# Patient Record
Sex: Male | Born: 1970 | Race: White | Hispanic: No | Marital: Married | Smoking: Never smoker
Health system: Southern US, Community
[De-identification: ages and names within clinical notes are randomized; demographics above are authoritative.]

## PROBLEM LIST (undated history)

## (undated) ENCOUNTER — Ambulatory Visit: Admission: EM

## (undated) DIAGNOSIS — E669 Obesity, unspecified: Secondary | ICD-10-CM

## (undated) DIAGNOSIS — J449 Chronic obstructive pulmonary disease, unspecified: Secondary | ICD-10-CM

## (undated) DIAGNOSIS — I10 Essential (primary) hypertension: Secondary | ICD-10-CM

## (undated) DIAGNOSIS — E119 Type 2 diabetes mellitus without complications: Secondary | ICD-10-CM

## (undated) HISTORY — DX: Type 2 diabetes mellitus without complications: E11.9

## (undated) HISTORY — DX: Essential (primary) hypertension: I10

---

## 2006-03-12 ENCOUNTER — Emergency Department (HOSPITAL_COMMUNITY): Admission: EM | Admit: 2006-03-12 | Discharge: 2006-03-12 | Payer: Self-pay | Admitting: Emergency Medicine

## 2006-04-05 ENCOUNTER — Ambulatory Visit: Payer: Self-pay | Admitting: Internal Medicine

## 2006-04-12 ENCOUNTER — Ambulatory Visit: Payer: Self-pay | Admitting: Internal Medicine

## 2009-03-25 ENCOUNTER — Ambulatory Visit: Payer: Self-pay | Admitting: Internal Medicine

## 2010-10-12 ENCOUNTER — Emergency Department: Payer: Self-pay | Admitting: Emergency Medicine

## 2012-03-01 ENCOUNTER — Emergency Department: Payer: Self-pay | Admitting: Emergency Medicine

## 2012-07-20 ENCOUNTER — Emergency Department: Payer: Self-pay | Admitting: Emergency Medicine

## 2013-01-08 ENCOUNTER — Emergency Department: Payer: Self-pay | Admitting: Emergency Medicine

## 2013-01-08 LAB — BASIC METABOLIC PANEL
Anion Gap: 3 — ABNORMAL LOW (ref 7–16)
BUN: 13 mg/dL (ref 7–18)
Chloride: 104 mmol/L (ref 98–107)
Creatinine: 1.18 mg/dL (ref 0.60–1.30)
Glucose: 147 mg/dL — ABNORMAL HIGH (ref 65–99)
Osmolality: 280 (ref 275–301)
Sodium: 139 mmol/L (ref 136–145)

## 2013-01-08 LAB — CK TOTAL AND CKMB (NOT AT ARMC)
CK, Total: 119 U/L (ref 35–232)
CK-MB: 2 ng/mL (ref 0.5–3.6)

## 2013-01-09 LAB — TROPONIN I: Troponin-I: <0.02 ng/mL

## 2013-01-09 LAB — CBC
HCT: 36.8 % — ABNORMAL LOW (ref 40.0–52.0)
HGB: 12.7 g/dL — ABNORMAL LOW (ref 13.0–18.0)
MCH: 27.7 pg (ref 26.0–34.0)
Platelet: 262 10*3/uL (ref 150–440)

## 2013-01-11 DIAGNOSIS — G4730 Sleep apnea, unspecified: Secondary | ICD-10-CM | POA: Insufficient documentation

## 2013-01-11 DIAGNOSIS — I160 Hypertensive urgency: Secondary | ICD-10-CM

## 2013-01-11 DIAGNOSIS — G4733 Obstructive sleep apnea (adult) (pediatric): Secondary | ICD-10-CM | POA: Insufficient documentation

## 2013-01-11 HISTORY — DX: Hypertensive urgency: I16.0

## 2013-04-01 ENCOUNTER — Ambulatory Visit: Payer: Self-pay | Admitting: Family Medicine

## 2013-04-05 ENCOUNTER — Ambulatory Visit: Payer: Self-pay

## 2013-06-11 ENCOUNTER — Ambulatory Visit: Payer: Self-pay

## 2013-06-22 ENCOUNTER — Ambulatory Visit: Payer: Self-pay

## 2013-08-31 ENCOUNTER — Ambulatory Visit: Payer: Self-pay | Admitting: Physician Assistant

## 2013-09-09 ENCOUNTER — Ambulatory Visit: Payer: Self-pay | Admitting: Family Medicine

## 2013-09-10 ENCOUNTER — Ambulatory Visit: Payer: Self-pay | Admitting: Emergency Medicine

## 2013-09-25 ENCOUNTER — Ambulatory Visit: Payer: Self-pay | Admitting: Physician Assistant

## 2013-10-16 ENCOUNTER — Ambulatory Visit: Payer: Self-pay | Admitting: Physician Assistant

## 2013-12-21 DIAGNOSIS — E118 Type 2 diabetes mellitus with unspecified complications: Secondary | ICD-10-CM | POA: Insufficient documentation

## 2013-12-21 DIAGNOSIS — N529 Male erectile dysfunction, unspecified: Secondary | ICD-10-CM | POA: Insufficient documentation

## 2013-12-21 DIAGNOSIS — E1165 Type 2 diabetes mellitus with hyperglycemia: Secondary | ICD-10-CM | POA: Insufficient documentation

## 2013-12-21 DIAGNOSIS — E1129 Type 2 diabetes mellitus with other diabetic kidney complication: Secondary | ICD-10-CM | POA: Insufficient documentation

## 2013-12-21 DIAGNOSIS — E119 Type 2 diabetes mellitus without complications: Secondary | ICD-10-CM | POA: Insufficient documentation

## 2013-12-21 DIAGNOSIS — I152 Hypertension secondary to endocrine disorders: Secondary | ICD-10-CM | POA: Insufficient documentation

## 2013-12-21 DIAGNOSIS — I10 Essential (primary) hypertension: Secondary | ICD-10-CM | POA: Insufficient documentation

## 2013-12-28 DIAGNOSIS — D509 Iron deficiency anemia, unspecified: Secondary | ICD-10-CM | POA: Insufficient documentation

## 2014-09-30 ENCOUNTER — Ambulatory Visit: Payer: Self-pay | Admitting: Physician Assistant

## 2015-07-23 IMAGING — CR DG KNEE STANDING AP BILAT
3 series · 3 of 3 positions shown · non-contrast
Comparison: 04/01/2013 RIGHT knee radiographs

CLINICAL DATA: Fell 2 days ago onto RIGHT knee, pain at knee joint
especially laterally and posteriorly, hard time flexing RIGHT knee

EXAM:
RIGHT KNEE - COMPLETE 4+ VIEW; BILATERAL KNEES STANDING - 1 VIEW

[knee ap]
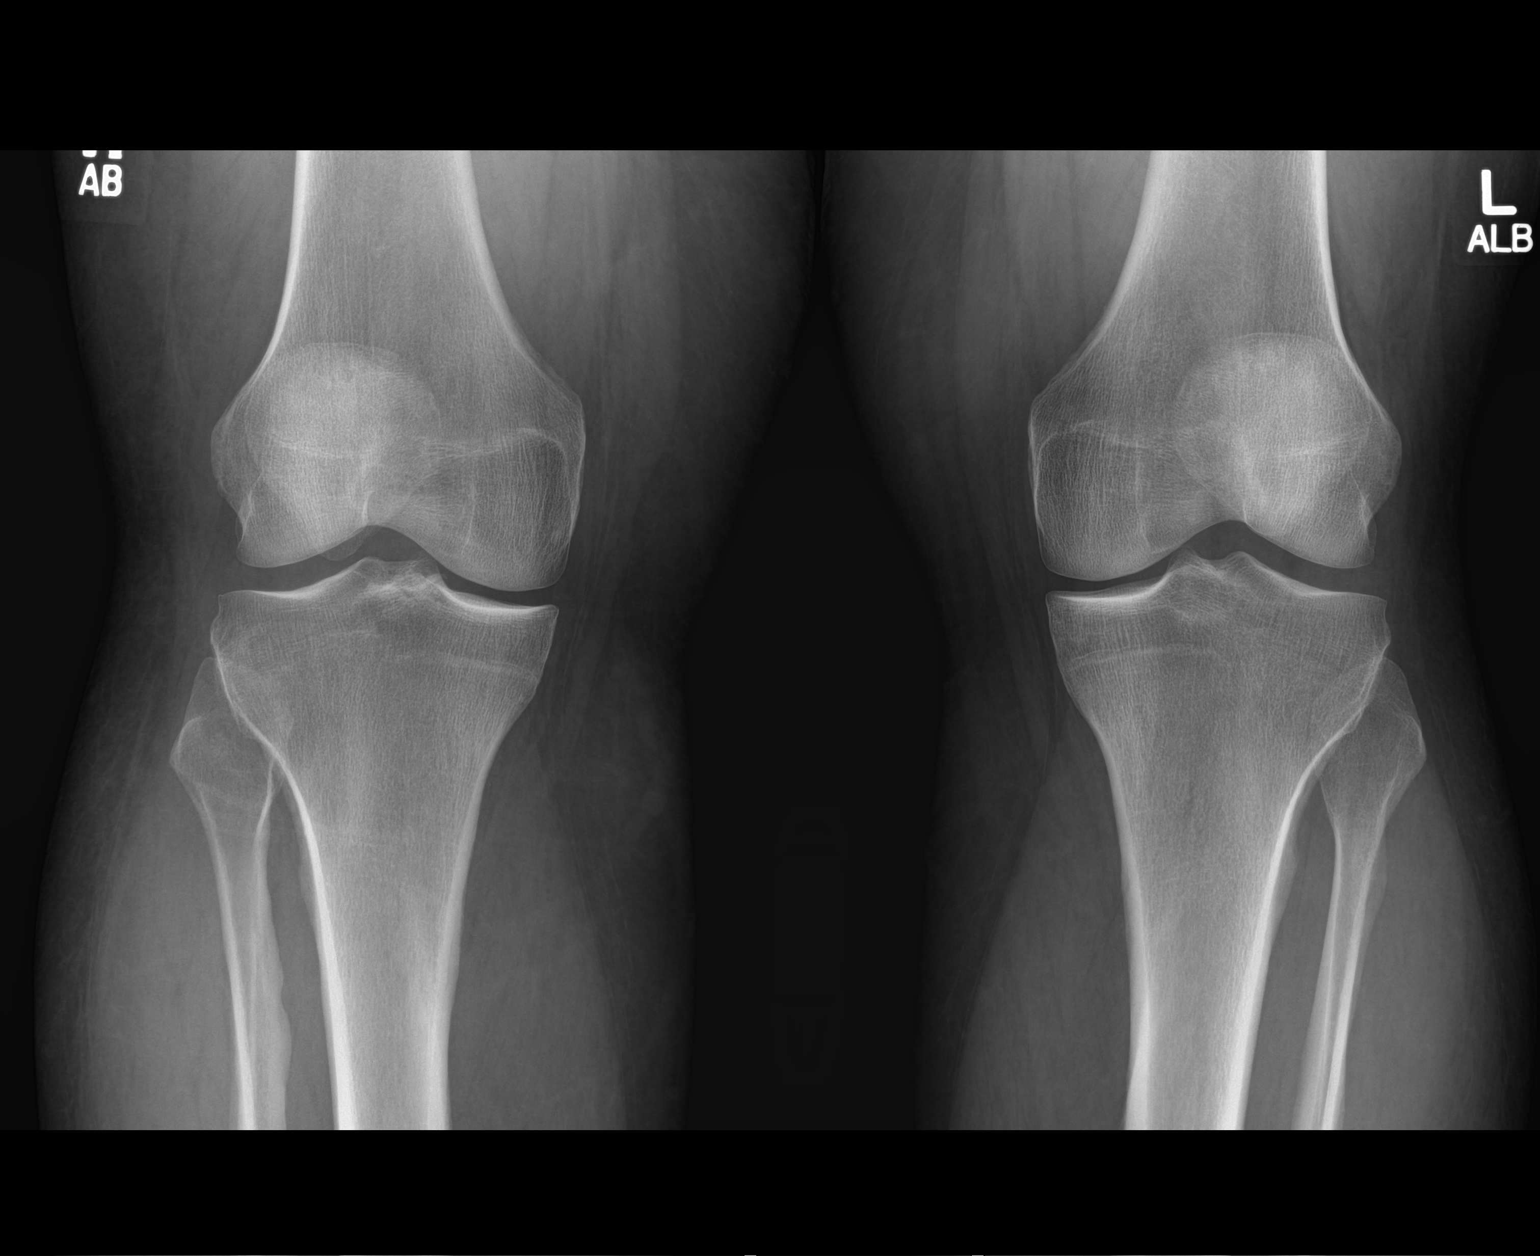

[knee lat (1 of 2)]
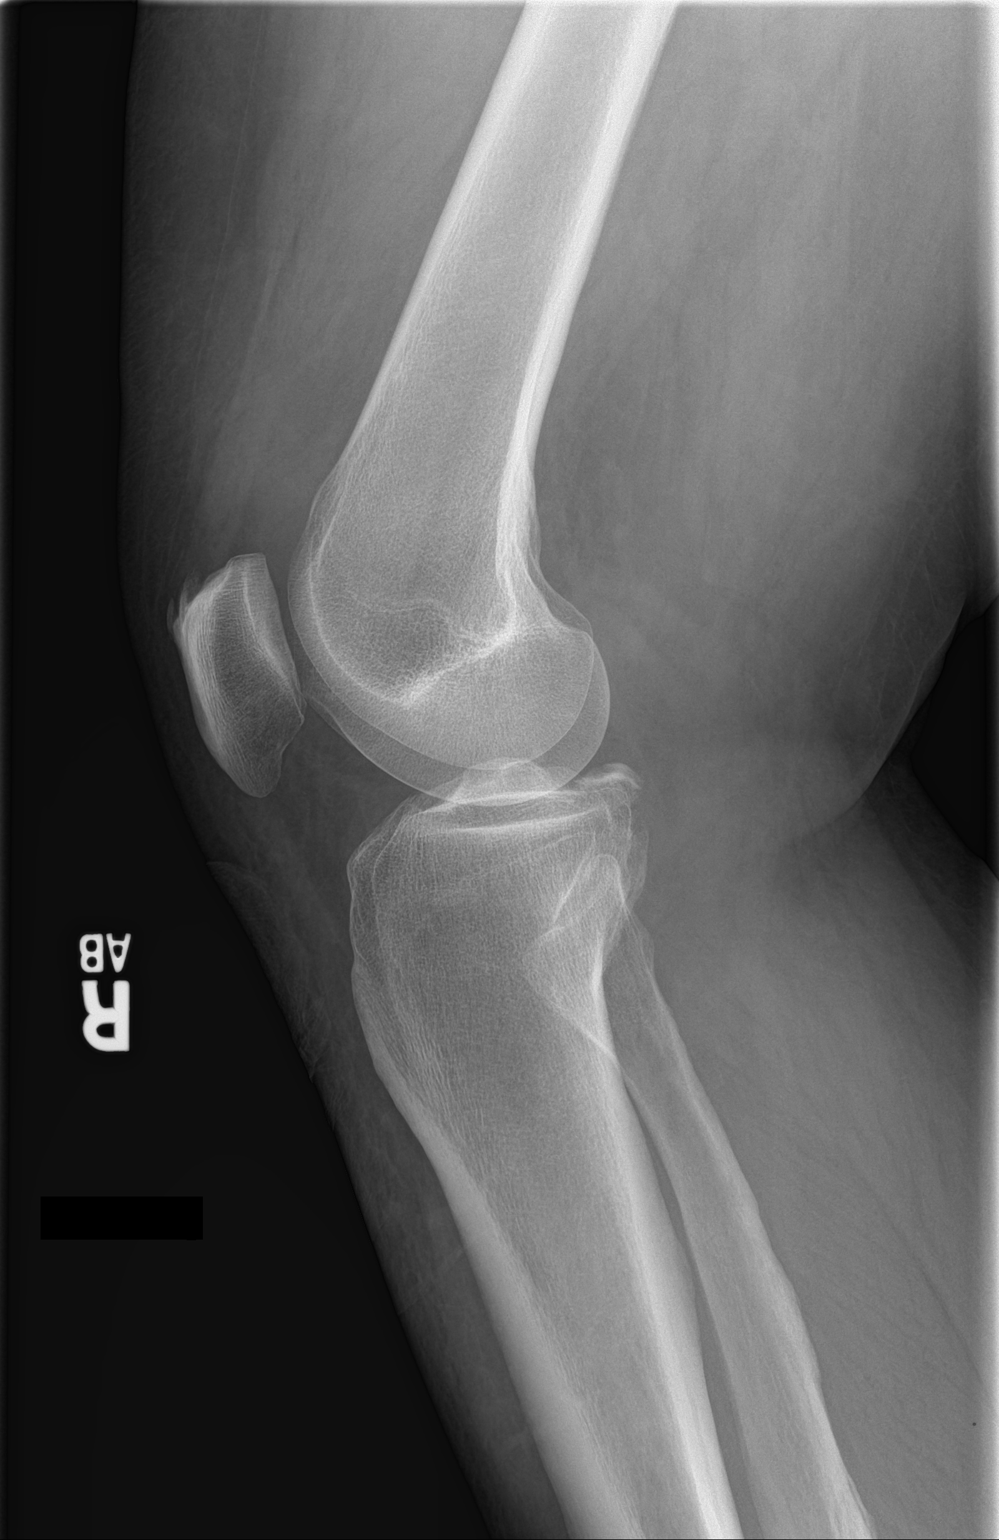

[knee lat (2 of 2)]
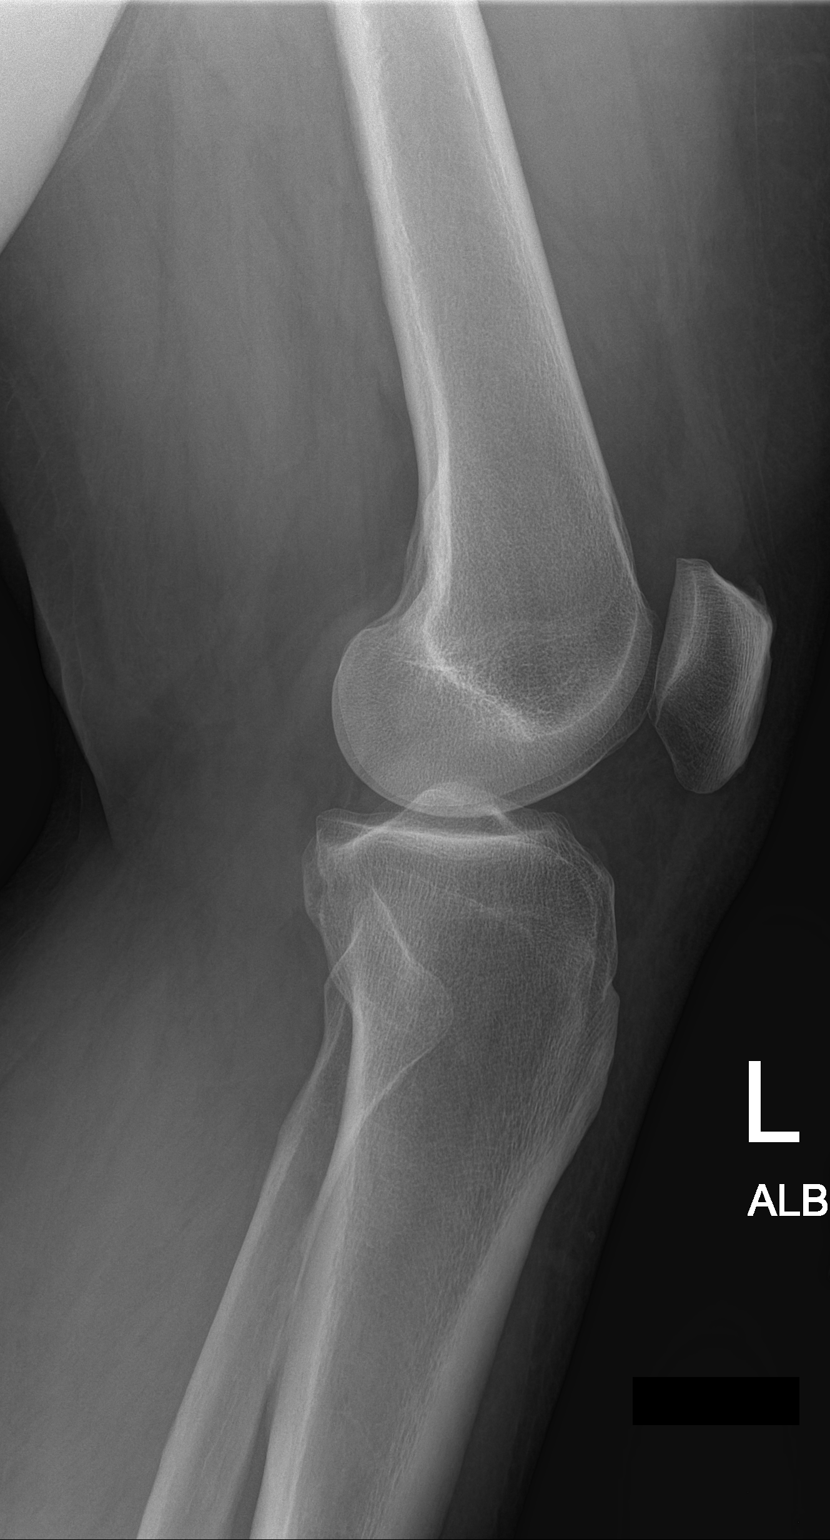

[3 of 3 positions shown; findings below may reference images not displayed]

FINDINGS: RIGHT knee:

Osseous demineralization.

Mild medial compartment joint space narrowing.

New bony irregularity identified at the posterior margin of the
tibial plateau on the lateral view, not localized on anterior view,
question central.

Posterior central avulsion fracture not excluded, potentially at
insertion site of PCL.

No additional fracture, dislocation or bone destruction.

Small patellar spur at quadriceps tendon insertion.

Minimal knee joint effusion.

LEFT knee:

Osseous demineralization.

Medial compartment joint space narrowing.

No acute fracture, dislocation or bone destruction.

No knee joint effusion.
IMPRESSION: No acute LEFT knee abnormalities.

Minimal degenerative changes of both knees.

New bony density identified at the posterior margin of the RIGHT
tibia on the lateral view, new since 0157, cannot exclude a
posterior central avulsion fracture potentially at the PCL insertion
site.

Correlation with MR recommended.

## 2016-05-11 DIAGNOSIS — E1169 Type 2 diabetes mellitus with other specified complication: Secondary | ICD-10-CM | POA: Insufficient documentation

## 2016-05-11 DIAGNOSIS — E785 Hyperlipidemia, unspecified: Secondary | ICD-10-CM | POA: Insufficient documentation

## 2022-12-03 ENCOUNTER — Encounter: Payer: Self-pay | Admitting: Family Medicine

## 2022-12-03 ENCOUNTER — Ambulatory Visit: Payer: 59 | Admitting: Family Medicine

## 2022-12-03 VITALS — BP 138/78 | HR 88 | Ht 71.0 in | Wt 322.0 lb

## 2022-12-03 DIAGNOSIS — G4733 Obstructive sleep apnea (adult) (pediatric): Secondary | ICD-10-CM

## 2022-12-03 DIAGNOSIS — I152 Hypertension secondary to endocrine disorders: Secondary | ICD-10-CM

## 2022-12-03 DIAGNOSIS — E1159 Type 2 diabetes mellitus with other circulatory complications: Secondary | ICD-10-CM | POA: Diagnosis not present

## 2022-12-03 DIAGNOSIS — E118 Type 2 diabetes mellitus with unspecified complications: Secondary | ICD-10-CM | POA: Diagnosis not present

## 2022-12-03 DIAGNOSIS — Z7984 Long term (current) use of oral hypoglycemic drugs: Secondary | ICD-10-CM

## 2022-12-03 DIAGNOSIS — E119 Type 2 diabetes mellitus without complications: Secondary | ICD-10-CM

## 2022-12-03 DIAGNOSIS — Z1211 Encounter for screening for malignant neoplasm of colon: Secondary | ICD-10-CM

## 2022-12-03 DIAGNOSIS — Z1159 Encounter for screening for other viral diseases: Secondary | ICD-10-CM

## 2022-12-03 DIAGNOSIS — Z114 Encounter for screening for human immunodeficiency virus [HIV]: Secondary | ICD-10-CM

## 2022-12-03 DIAGNOSIS — E785 Hyperlipidemia, unspecified: Secondary | ICD-10-CM

## 2022-12-03 DIAGNOSIS — E1169 Type 2 diabetes mellitus with other specified complication: Secondary | ICD-10-CM

## 2022-12-03 DIAGNOSIS — Z125 Encounter for screening for malignant neoplasm of prostate: Secondary | ICD-10-CM

## 2022-12-03 MED ORDER — CHLORTHALIDONE 25 MG PO TABS
12.5000 mg | ORAL_TABLET | Freq: Every day | ORAL | 0 refills | Status: DC
Start: 2022-12-03 — End: 2023-01-21

## 2022-12-03 MED ORDER — LISINOPRIL 20 MG PO TABS
20.0000 mg | ORAL_TABLET | Freq: Every day | ORAL | 0 refills | Status: DC
Start: 2022-12-03 — End: 2022-12-19

## 2022-12-03 MED ORDER — AMLODIPINE BESYLATE 10 MG PO TABS: 10.0000 mg | ORAL_TABLET | Freq: Every day | ORAL | 0 refills | Status: DC

## 2022-12-03 NOTE — Progress Notes (Signed)
SUBJECTIVE:   Chief Complaint  Patient presents with   Establish Care   HPI Patient presents to clinic to establish care.  Hypertension Asymptomatic.  Currently takes amlodipine 10 mg daily, hydrochlorothiazide 25 mg daily and lisinopril 20 mg daily.  Requesting refills on all medications.  Denies any headaches, visual changes, chest pain, shortness of breath or lower extremity edema.  DM type II Asymptomatic.  Denies any polyuria, polydipsia, polyphagia.  Previously tried metformin was unable to tolerate secondary to side effects.  Currently on glipizide 5 mg twice daily.  Hyperlipidemia Not currently on statin therapy.  Was previously prescribed Lipitor 40 mg daily.  Unclear why discontinued.  OSA Previous sleep studies indicated need for CPAP.  Self discontinued 2012 as was unable to tolerate.  Not interested in restarting CPAP   PERTINENT PMH / PSH: DM type II Morbid obesity Hypertension Hyperlipidemia OSA  OBJECTIVE:  BP 138/78   Pulse 88   Ht 5\' 11"  (1.803 m)   Wt (!) 322 lb (146.1 kg)   SpO2 98%   BMI 44.91 kg/m    Physical Exam Vitals reviewed.  Constitutional:      Appearance: He is obese.  HENT:     Head: Normocephalic.     Right Ear: Tympanic membrane, ear canal and external ear normal.     Left Ear: Tympanic membrane, ear canal and external ear normal.     Nose: Nose normal.     Mouth/Throat:     Mouth: Mucous membranes are moist.  Eyes:     Conjunctiva/sclera: Conjunctivae normal.     Pupils: Pupils are equal, round, and reactive to light.  Neck:     Thyroid: No thyromegaly or thyroid tenderness.     Vascular: No carotid bruit.  Cardiovascular:     Rate and Rhythm: Normal rate and regular rhythm.     Pulses: Normal pulses.     Heart sounds: Normal heart sounds.  Pulmonary:     Effort: Pulmonary effort is normal.     Breath sounds: Normal breath sounds.  Abdominal:     General: Abdomen is flat. Bowel sounds are normal.     Palpations:  Abdomen is soft.  Musculoskeletal:        General: Normal range of motion.     Cervical back: Normal range of motion and neck supple.     Right lower leg: No edema.     Left lower leg: No edema.  Lymphadenopathy:     Cervical: No cervical adenopathy.  Neurological:     Mental Status: He is alert.  Psychiatric:        Mood and Affect: Mood normal.        Behavior: Behavior normal.        Thought Content: Thought content normal.        Judgment: Judgment normal.       12/03/2022    3:56 PM  Depression screen PHQ 2/9  Decreased Interest 0  Down, Depressed, Hopeless 0  PHQ - 2 Score 0  Altered sleeping 0  Tired, decreased energy 0  Change in appetite 2  Feeling bad or failure about yourself  0  Trouble concentrating 0  Moving slowly or fidgety/restless 0  Suicidal thoughts 0  PHQ-9 Score 2  Difficult doing work/chores Not difficult at all        12/03/2022    3:56 PM  GAD 7 : Generalized Anxiety Score  Nervous, Anxious, on Edge 0  Control/stop worrying 0  Worry  too much - different things 0  Trouble relaxing 0  Restless 0  Easily annoyed or irritable 0  Afraid - awful might happen 0  Total GAD 7 Score 0  Anxiety Difficulty Not difficult at all      ASSESSMENT/PLAN:  Diabetes mellitus type 2 with complications [facility]) Assessment & Plan: Chronic.  Does not check CBGs at home.  Currently asymptomatic.  Previously tried metformin but unable to tolerate secondary to side effects Takes glipizide 5 mg twice daily Would benefit from semaglutide or tirzepatide weekly injectable No history of thyroid cancer or family history of thyroid cancer Check A1c, urine ACR On ACE, recommend statin therapy Recommend diabetic eye exam.  Referral sent to ophthalmology Recommend annual foot exam.  Will complete at next visit Follow-up in 2 weeks  Orders: -     Hemoglobin A1c; Future -     Vitamin B12; Future -     Microalbumin / creatinine urine ratio; Future  Hypertension  associated with diabetes [facility]) Assessment & Plan: Chronic.  Not currently at goal less than 130/80. Refill Norvasc 10 mg daily Refill lisinopril 20 mg daily Switch HydroDIURIL to chlorthalidone 12.5 mg daily. Monitor blood pressure at home, goal less than 130/80.   Check c-Met Follow-up in 2 weeks  Orders: -     Comprehensive metabolic panel; Future -     amLODIPine Besylate; Take 1 tablet (10 mg total) by mouth daily.  Dispense: 30 tablet; Refill: 0 -     Chlorthalidone; Take 0.5 tablets (12.5 mg total) by mouth daily.  Dispense: 30 tablet; Refill: 0 -     Lisinopril; Take 1 tablet (20 mg total) by mouth daily.  Dispense: 30 tablet; Refill: 0  Hyperlipidemia associated with type 2 diabetes mellitus (HCC) Assessment & Plan: Chronic.  Not currently on statin therapy.  Was previously prescribed atorvastatin 40 mg daily. Will check fasting lipids Recommend restarting stating therapy, switching to Crestor 10 mg daily    Orders: -     Lipid panel; Future  Morbid obesity (HCC) Assessment & Plan: Chronic.  BMI greater than 40 with serious comorbidities, hypertension, type 2 diabetes uncontrolled, hyperlipidemia Would benefit from weight loss and bariatric surgery referral. Will discuss at next visit Plan to discuss injectable GLP-1/GIP pending labs.  Orders: -     CBC with Differential/Platelet; Future -     TSH; Future -     VITAMIN D 25 Hydroxy (Vit-D Deficiency, Fractures); Future  Diabetic eye exam [facility]) -     Ambulatory referral to Ophthalmology  Encounter for screening for HIV -     HIV Antibody (routine testing w rflx); Future  Encounter for hepatitis C screening test for low risk patient -     Hepatitis C antibody; Future  Colon cancer screening -     Ambulatory referral to Gastroenterology  Prostate cancer screening -     PSA; Future  OSA (obstructive sleep apnea) Assessment & Plan: Chronic.  Self discontinued CPAP 2012.  Not interested in resuming  therapy.    HCM Diabetic foot exam at next visit Ophthalmology referral sent for diabetic eye exam Referral sent for colonoscopy Recommend shingles vaccine Recommend pneumonia 20 vaccine Hepatitis C/HIV screening labs  PSA screening Depression/anxiety screening completed  PDMP reviewed  Return in about 2 weeks (around 12/17/2022) for PCP.  Dana Allan, MD

## 2022-12-03 NOTE — Patient Instructions (Addendum)
It was a pleasure meeting you today. Thank you for allowing me to take part in your health care.  Our goals for today as we discussed include:  Referral sent for Colonoscopy and Diabetic Eye exam  Schedule appointment for fasting blood work.  Fast for 10 hours  Refills sent for requested medications  Follow up in 2 weeks  Recommend Shingles vaccine.  This is a 2 dose series and can be given at your local pharmacy.  Please talk to your pharmacist about this.   If you have any questions or concerns, please do not hesitate to call the office at [phone].  I look forward to our next visit and until then take care and stay safe.  Regards,   Dana Allan, MD   [facility]

## 2022-12-11 ENCOUNTER — Other Ambulatory Visit: Payer: Self-pay

## 2022-12-11 DIAGNOSIS — E118 Type 2 diabetes mellitus with unspecified complications: Secondary | ICD-10-CM

## 2022-12-11 NOTE — Telephone Encounter (Signed)
Prescription Request  12/11/2022  LOV: Visit date not found  What is the name of the medication or equipment?  glipiZIDE (GLUCOTROL) 5 MG tablet   Have you contacted your pharmacy to request a refill? Yes   Which pharmacy would you like this sent to?  [facility] [address] [place] - [address] [place]) [place] [address] Phone: [phone] Fax: [phone]    Patient notified that their request is being sent to the clinical staff for review and that they should receive a response within 2 business days.   Please advise at [phone]  Patient states he took his last pill either Sunday or Monday, so he is out of this medication.

## 2022-12-12 ENCOUNTER — Encounter: Payer: Self-pay | Admitting: *Deleted

## 2022-12-12 MED ORDER — GLIPIZIDE 5 MG PO TABS
5.0000 mg | ORAL_TABLET | Freq: Two times a day (BID) | ORAL | 3 refills | Status: DC
Start: 2022-12-12 — End: 2023-06-10

## 2022-12-12 NOTE — Telephone Encounter (Signed)
Medication pended for approval (historical)

## 2022-12-14 ENCOUNTER — Other Ambulatory Visit (INDEPENDENT_AMBULATORY_CARE_PROVIDER_SITE_OTHER): Payer: 59

## 2022-12-14 DIAGNOSIS — E1169 Type 2 diabetes mellitus with other specified complication: Secondary | ICD-10-CM

## 2022-12-14 DIAGNOSIS — Z125 Encounter for screening for malignant neoplasm of prostate: Secondary | ICD-10-CM | POA: Diagnosis not present

## 2022-12-14 DIAGNOSIS — Z7984 Long term (current) use of oral hypoglycemic drugs: Secondary | ICD-10-CM | POA: Diagnosis not present

## 2022-12-14 DIAGNOSIS — Z1159 Encounter for screening for other viral diseases: Secondary | ICD-10-CM

## 2022-12-14 DIAGNOSIS — E785 Hyperlipidemia, unspecified: Secondary | ICD-10-CM | POA: Diagnosis not present

## 2022-12-14 DIAGNOSIS — E118 Type 2 diabetes mellitus with unspecified complications: Secondary | ICD-10-CM

## 2022-12-14 DIAGNOSIS — E1159 Type 2 diabetes mellitus with other circulatory complications: Secondary | ICD-10-CM

## 2022-12-14 DIAGNOSIS — I152 Hypertension secondary to endocrine disorders: Secondary | ICD-10-CM

## 2022-12-14 DIAGNOSIS — Z114 Encounter for screening for human immunodeficiency virus [HIV]: Secondary | ICD-10-CM

## 2022-12-14 LAB — PSA: PSA: 0.24 ng/mL (ref 0.10–4.00)

## 2022-12-14 LAB — LDL CHOLESTEROL, DIRECT: Direct LDL: 122 mg/dL

## 2022-12-14 LAB — LIPID PANEL
Cholesterol: 198 mg/dL (ref 0–200)
HDL: 36.3 mg/dL — ABNORMAL LOW (ref >39.00)
NonHDL: 161.5
Total CHOL/HDL Ratio: 5
Triglycerides: 246 mg/dL — ABNORMAL HIGH (ref 0.0–149.0)
VLDL: 49.2 mg/dL — ABNORMAL HIGH (ref 0.0–40.0)

## 2022-12-14 LAB — CBC WITH DIFFERENTIAL/PLATELET
Basophils Absolute: 0.1 10*3/uL (ref 0.0–0.1)
Basophils Relative: 0.9 % (ref 0.0–3.0)
Eosinophils Absolute: 0.3 10*3/uL (ref 0.0–0.7)
Eosinophils Relative: 3 % (ref 0.0–5.0)
HCT: 42.3 % (ref 39.0–52.0)
Hemoglobin: 14.3 g/dL (ref 13.0–17.0)
Lymphocytes Relative: 28.4 % (ref 12.0–46.0)
Lymphs Abs: 2.7 10*3/uL (ref 0.7–4.0)
MCHC: 33.9 g/dL (ref 30.0–36.0)
MCV: 80 fl (ref 78.0–100.0)
Monocytes Absolute: 0.7 10*3/uL (ref 0.1–1.0)
Monocytes Relative: 7 % (ref 3.0–12.0)
Neutro Abs: 5.8 10*3/uL (ref 1.4–7.7)
Neutrophils Relative %: 60.7 % (ref 43.0–77.0)
Platelets: 297 10*3/uL (ref 150.0–400.0)
RBC: 5.29 Mil/uL (ref 4.22–5.81)
RDW: 14.6 % (ref 11.5–15.5)
WBC: 9.6 10*3/uL (ref 4.0–10.5)

## 2022-12-14 LAB — COMPREHENSIVE METABOLIC PANEL
ALT: 37 U/L (ref 0–53)
AST: 25 U/L (ref 0–37)
Albumin: 3.9 g/dL (ref 3.5–5.2)
Alkaline Phosphatase: 70 U/L (ref 39–117)
BUN: 10 mg/dL (ref 6–23)
CO2: 32 mEq/L (ref 19–32)
Calcium: 9 mg/dL (ref 8.4–10.5)
Chloride: 97 mEq/L (ref 96–112)
Creatinine, Ser: 0.64 mg/dL (ref 0.40–1.50)
GFR: 107.75 mL/min (ref >60.00)
Glucose, Bld: 190 mg/dL — ABNORMAL HIGH (ref 70–99)
Potassium: 3.5 mEq/L (ref 3.5–5.1)
Sodium: 139 mEq/L (ref 135–145)
Total Bilirubin: 0.6 mg/dL (ref 0.2–1.2)
Total Protein: 7 g/dL (ref 6.0–8.3)

## 2022-12-14 LAB — HEMOGLOBIN A1C: Hgb A1c MFr Bld: 11.5 % — ABNORMAL HIGH (ref 4.6–6.5)

## 2022-12-14 LAB — MICROALBUMIN / CREATININE URINE RATIO
Creatinine,U: 100.1 mg/dL
Microalb Creat Ratio: 29.2 mg/g (ref 0.0–30.0)
Microalb, Ur: 29.2 mg/dL — ABNORMAL HIGH (ref 0.0–1.9)

## 2022-12-14 LAB — VITAMIN D 25 HYDROXY (VIT D DEFICIENCY, FRACTURES): VITD: 20.91 ng/mL — ABNORMAL LOW (ref 30.00–100.00)

## 2022-12-14 LAB — TSH: TSH: 2.55 u[IU]/mL (ref 0.35–5.50)

## 2022-12-14 LAB — VITAMIN B12: Vitamin B-12: 526 pg/mL (ref 211–911)

## 2022-12-15 LAB — HIV ANTIBODY (ROUTINE TESTING W REFLEX): HIV 1&2 Ab, 4th Generation: NONREACTIVE

## 2022-12-15 LAB — HEPATITIS C ANTIBODY: Hepatitis C Ab: NONREACTIVE

## 2022-12-17 DIAGNOSIS — Z125 Encounter for screening for malignant neoplasm of prostate: Secondary | ICD-10-CM | POA: Insufficient documentation

## 2022-12-17 DIAGNOSIS — Z1159 Encounter for screening for other viral diseases: Secondary | ICD-10-CM | POA: Insufficient documentation

## 2022-12-17 DIAGNOSIS — E119 Type 2 diabetes mellitus without complications: Secondary | ICD-10-CM | POA: Insufficient documentation

## 2022-12-17 DIAGNOSIS — Z114 Encounter for screening for human immunodeficiency virus [HIV]: Secondary | ICD-10-CM | POA: Insufficient documentation

## 2022-12-17 DIAGNOSIS — Z1211 Encounter for screening for malignant neoplasm of colon: Secondary | ICD-10-CM

## 2022-12-17 HISTORY — DX: Encounter for screening for malignant neoplasm of colon: Z12.11

## 2022-12-17 NOTE — Assessment & Plan Note (Signed)
Chronic.  Not currently on statin therapy.  Was previously prescribed atorvastatin 40 mg daily. Will check fasting lipids Recommend restarting stating therapy, switching to Crestor 10 mg daily

## 2022-12-17 NOTE — Assessment & Plan Note (Signed)
Chronic.  Not currently at goal less than 130/80. Refill Norvasc 10 mg daily Refill lisinopril 20 mg daily Switch HydroDIURIL to chlorthalidone 12.5 mg daily. Monitor blood pressure at home, goal less than 130/80.   Check c-Met Follow-up in 2 weeks

## 2022-12-17 NOTE — Assessment & Plan Note (Addendum)
Chronic.  Does not check CBGs at home.  Currently asymptomatic.  Previously tried metformin but unable to tolerate secondary to side effects Takes glipizide 5 mg twice daily Would benefit from semaglutide or tirzepatide weekly injectable No history of thyroid cancer or family history of thyroid cancer Check A1c, urine ACR On ACE, recommend statin therapy Recommend diabetic eye exam.  Referral sent to ophthalmology Recommend annual foot exam.  Will complete at next visit Follow-up in 2 weeks

## 2022-12-17 NOTE — Assessment & Plan Note (Signed)
Chronic.  BMI greater than 40 with serious comorbidities, hypertension, type 2 diabetes uncontrolled, hyperlipidemia Would benefit from weight loss and bariatric surgery referral. Will discuss at next visit Plan to discuss injectable GLP-1/GIP pending labs.

## 2022-12-17 NOTE — Assessment & Plan Note (Signed)
Chronic.  Self discontinued CPAP 2012.  Not interested in resuming therapy.

## 2022-12-17 NOTE — Assessment & Plan Note (Signed)
>>  ASSESSMENT AND PLAN FOR OBESITY, CLASS III, BMI 40-49.9 (MORBID OBESITY) (HCC) WRITTEN ON 12/17/2022  7:13 PM BY WALSH, TANYA, MD  Chronic.  BMI greater than 40 with serious comorbidities, hypertension, type 2 diabetes uncontrolled, hyperlipidemia Would benefit from weight loss and bariatric surgery referral. Will discuss at next visit Plan to discuss injectable GLP-1/GIP pending labs.

## 2022-12-19 ENCOUNTER — Encounter: Payer: Self-pay | Admitting: Family Medicine

## 2022-12-19 ENCOUNTER — Ambulatory Visit: Payer: 59 | Admitting: Family Medicine

## 2022-12-19 VITALS — BP 148/82 | HR 94 | Ht 71.0 in | Wt 320.0 lb

## 2022-12-19 DIAGNOSIS — Z7984 Long term (current) use of oral hypoglycemic drugs: Secondary | ICD-10-CM

## 2022-12-19 DIAGNOSIS — E785 Hyperlipidemia, unspecified: Secondary | ICD-10-CM | POA: Diagnosis not present

## 2022-12-19 DIAGNOSIS — E118 Type 2 diabetes mellitus with unspecified complications: Secondary | ICD-10-CM

## 2022-12-19 DIAGNOSIS — E1169 Type 2 diabetes mellitus with other specified complication: Secondary | ICD-10-CM | POA: Diagnosis not present

## 2022-12-19 DIAGNOSIS — I152 Hypertension secondary to endocrine disorders: Secondary | ICD-10-CM

## 2022-12-19 DIAGNOSIS — E1159 Type 2 diabetes mellitus with other circulatory complications: Secondary | ICD-10-CM

## 2022-12-19 MED ORDER — METFORMIN HCL ER 500 MG PO TB24
500.0000 mg | ORAL_TABLET | Freq: Every day | ORAL | 3 refills | Status: DC
Start: 2022-12-19 — End: 2023-03-21

## 2022-12-19 MED ORDER — LISINOPRIL 20 MG PO TABS: 40.0000 mg | ORAL_TABLET | Freq: Every evening | ORAL | 3 refills | Status: DC

## 2022-12-19 MED ORDER — LISINOPRIL 20 MG PO TABS: 40.0000 mg | ORAL_TABLET | Freq: Every day | ORAL | 3 refills | Status: DC

## 2022-12-19 MED ORDER — EMPAGLIFLOZIN 25 MG PO TABS
25.0000 mg | ORAL_TABLET | Freq: Every day | ORAL | 3 refills | Status: DC
Start: 2022-12-19 — End: 2023-06-10

## 2022-12-19 NOTE — Patient Instructions (Addendum)
It was a pleasure meeting you today. Thank you for allowing me to take part in your health care.  Our goals for today as we discussed include:  Increase Lisinopril to 40 mg at night  Start Jardiance 25 mg daily Start Metformin XR 500 mg daily with breakfast  Recommend starting Crestor 10 mg daily to help lower cholesterol Risk of major cardiovascular event in 10 years in 8.6%.  Borderline  Follow up in 3 months   If you have any questions or concerns, please do not hesitate to call the office at [phone].  I look forward to our next visit and until then take care and stay safe.  Regards,   Dana Allan, MD   [facility]

## 2022-12-19 NOTE — Progress Notes (Signed)
SUBJECTIVE:   Chief Complaint  Patient presents with   Medical Management of Chronic Issues   HPI Patient presents to clinic to follow-up chronic care management discussed recent labs  Hypertension Asymptomatic.  Currently takes amlodipine 10 mg daily, chlorthalidone 25 mg daily and lisinopril 20 mg daily.  Does not check blood pressures at home.  Denies any headaches, visual changes, chest pain, shortness of breath or lower extremity edema.  DM type II Asymptomatic.  Denies any polyuria, polydipsia, polyphagia. Currently on glipizide 5 mg twice daily.  Recent A1c increased to 11.5.  Patient not interested in injectable medication.  Offered insulin and Ozempic/Mounjaro.  Okay with retrying metformin XR and initiating Jardiance.  Hyperlipidemia Not currently on statin therapy.  Was previously prescribed Lipitor 40 mg daily.  LDL elevated 122, triglycerides 246.  Patient not interested in initiating statin therapy at this time.   PERTINENT PMH / PSH: DM type II Morbid obesity Hypertension Hyperlipidemia OSA  OBJECTIVE:  BP (!) 148/82   Pulse 94   Ht 5\' 11"  (1.803 m)   Wt (!) 320 lb (145.2 kg)   SpO2 95%   BMI 44.63 kg/m    Physical Exam Vitals reviewed.  Constitutional:      Appearance: He is obese.  HENT:     Head: Normocephalic.     Right Ear: Tympanic membrane, ear canal and external ear normal.     Left Ear: Tympanic membrane, ear canal and external ear normal.     Nose: Nose normal.     Mouth/Throat:     Mouth: Mucous membranes are moist.  Eyes:     Conjunctiva/sclera: Conjunctivae normal.     Pupils: Pupils are equal, round, and reactive to light.  Neck:     Thyroid: No thyromegaly or thyroid tenderness.     Vascular: No carotid bruit.  Cardiovascular:     Rate and Rhythm: Normal rate and regular rhythm.     Pulses: Normal pulses.     Heart sounds: Normal heart sounds.  Pulmonary:     Effort: Pulmonary effort is normal.     Breath sounds: Normal  breath sounds.  Abdominal:     General: Abdomen is flat. Bowel sounds are normal.     Palpations: Abdomen is soft.  Musculoskeletal:        General: Normal range of motion.     Cervical back: Normal range of motion and neck supple.     Right lower leg: No edema.     Left lower leg: No edema.  Lymphadenopathy:     Cervical: No cervical adenopathy.  Neurological:     Mental Status: He is alert.  Psychiatric:        Mood and Affect: Mood normal.        Behavior: Behavior normal.        Thought Content: Thought content normal.        Judgment: Judgment normal.    ASSESSMENT/PLAN:  Hypertension associated with diabetes (HCC) Assessment & Plan: Chronic.  Not currently at goal less than 130/80. Refill Norvasc 10 mg daily Increase lisinopril 20 mg to 2 tablets daily Continue Chlorthalidone 12.5 mg daily, plan to increase at next visit if not controlled Monitor blood pressure at home, goal less than 130/80.   Recent Cr wnl Follow-up in 3 months or sooner if remains >140/90  Orders: -     Lisinopril; Take 2 tablets (40 mg total) by mouth at bedtime.  Dispense: 60 tablet; Refill: 3 -  AMB Referral to Pharmacy Medication Management  Hyperlipidemia associated with type 2 diabetes mellitus (HCC) Assessment & Plan: Chronic.  Not at goal for DM 2.  Was previously prescribed atorvastatin 40 mg daily. LDL 122, Trigs 246 Recommend Crestor 20 mg daily, patient declined     Orders: -     AMB Referral to Pharmacy Medication Management  Diabetes mellitus type 2 with complications [facility]) Assessment & Plan: Chronic. CBG's at home 250-300.  Currently asymptomatic.  Previously tried metformin but unable to tolerate secondary to side effects.  Recent A!C 11.5.  Willing to retry extended release Takes glipizide 5 mg twice daily Start Metformin XR 500 mg daily Start Jardiance 25 mg daily Declined injectable medications, insulin, ozemoic, mpunjaro Check A1c, urine ACR On ACE, recommend  statin therapy Refer to pharmacy for diabetic management and financial assistance with medications. Recommend diabetic eye exam.  Referral sent to ophthalmology Recommend annual foot exam.  Will complete at next visit Follow-up in 3 months  Orders: -     Empagliflozin; Take 1 tablet (25 mg total) by mouth daily before breakfast.  Dispense: 30 tablet; Refill: 3 -     metFORMIN HCl ER; Take 1 tablet (500 mg total) by mouth daily with breakfast.  Dispense: 30 tablet; Refill: 3 -     AMB Referral to Pharmacy Medication Management  HCM Diabetic foot exam at next visit Ophthalmology referral sent for diabetic eye exam Referral sent for colonoscopy Recommend shingles vaccine Recommend pneumonia 20 vaccine Hepatitis C/HIV screening completed.  Received PCV 23 in 2015 PSA screening completed   PDMP reviewed  Return in about 3 months (around 03/21/2023) for PCP.  Dana Allan, MD

## 2022-12-23 NOTE — Assessment & Plan Note (Addendum)
Chronic. CBG's at home 250-300.  Currently asymptomatic.  Previously tried metformin but unable to tolerate secondary to side effects.  Recent A!C 11.5.  Willing to retry extended release Takes glipizide 5 mg twice daily Start Metformin XR 500 mg daily Start Jardiance 25 mg daily Declined injectable medications, insulin, ozemoic, mpunjaro Check A1c, urine ACR On ACE, recommend statin therapy Refer to pharmacy for diabetic management and financial assistance with medications. Recommend diabetic eye exam.  Referral sent to ophthalmology Recommend annual foot exam.  Will complete at next visit Follow-up in 3 months

## 2022-12-23 NOTE — Assessment & Plan Note (Signed)
Chronic.  Not at goal for DM 2.  Was previously prescribed atorvastatin 40 mg daily. LDL 122, Trigs 246 Recommend Crestor 20 mg daily, patient declined

## 2022-12-23 NOTE — Assessment & Plan Note (Addendum)
Chronic.  Not currently at goal less than 130/80. Refill Norvasc 10 mg daily Increase lisinopril 20 mg to 2 tablets daily Continue Chlorthalidone 12.5 mg daily, plan to increase at next visit if not controlled Monitor blood pressure at home, goal less than 130/80.   Recent Cr wnl Follow-up in 3 months or sooner if remains >140/90

## 2022-12-31 ENCOUNTER — Other Ambulatory Visit: Payer: Self-pay | Admitting: Family Medicine

## 2022-12-31 DIAGNOSIS — E1159 Type 2 diabetes mellitus with other circulatory complications: Secondary | ICD-10-CM

## 2023-01-02 ENCOUNTER — Telehealth: Payer: Self-pay

## 2023-01-02 NOTE — Progress Notes (Signed)
   Care Guide Note  01/02/2023 Name: Boston Cookson MRN: [id number] DOB: 02-09-1971  Referred by: Dana Allan, MD Reason for referral : Care Coordination (Outreach to schedule with pharm d )   Shaheen Mende is a 52 y.o. year old male who is a primary care patient of Dana Allan, MD. Clovis Mankins was referred to the pharmacist for assistance related to DM.    An unsuccessful telephone outreach was attempted today to contact the patient who was referred to the pharmacy team for assistance with medication assistance. Additional attempts will be made to contact the patient.   Penne Lash, RMA Care Guide [facility]  [place], [place] [address] Direct Dial: [phone] [email or web address]

## 2023-01-11 NOTE — Progress Notes (Signed)
   Care Guide Note  01/11/2023 Name: [name] MRN: [id number] DOB: 08/15/1970  Referred by: Dana Allan, MD Reason for referral : Care Coordination (Outreach to schedule with pharm d )   [name] is a 52 y.o. year old male who is a primary care patient of Dana Allan, MD. [name] was referred to the pharmacist for assistance related to HTN.    A third unsuccessful telephone outreach was attempted today to contact the patient who was referred to the pharmacy team for assistance with medication management. The Population Health team is pleased to engage with this patient at any time in the future upon receipt of referral and should he/she be interested in assistance from the [facility] team.   Penne Lash, RMA Care Guide [facility]  [place], [place] [address] Direct Dial: [phone] [email or web address]

## 2023-01-11 NOTE — Progress Notes (Signed)
Contacted patient regarding referral for hypertension from Dana Allan, MD .   Left patient a voicemail to return my call at their convenience  Catie TClearance Coots, PharmD, BCACP, CPP Clinical Pharmacist [facility] [phone]

## 2023-01-21 ENCOUNTER — Other Ambulatory Visit: Payer: Self-pay | Admitting: Family Medicine

## 2023-01-21 DIAGNOSIS — E1159 Type 2 diabetes mellitus with other circulatory complications: Secondary | ICD-10-CM

## 2023-01-21 NOTE — Progress Notes (Signed)
   Care Guide Note  01/21/2023 Name: [name] MRN: [id number] DOB: 1971/05/19  Referred by: Dana Allan, MD Reason for referral : Care Coordination (Outreach to schedule with pharm d )   [name] is a 52 y.o. year old male who is a primary care patient of Dana Allan, MD. [name] was referred to the pharmacist for assistance related to DM.    A third unsuccessful telephone outreach was attempted today to contact the patient who was referred to the pharmacy team for assistance with medication management. The Population Health team is pleased to engage with this patient at any time in the future upon receipt of referral and should he/she be interested in assistance from the [facility] team.   Penne Lash, RMA Care Guide [facility]  [place], [place] [address] Direct Dial: [phone] [email or web address]

## 2023-01-21 NOTE — Progress Notes (Signed)
   Care Guide Note  01/21/2023 Name: [name] MRN: [id number] DOB: 04-20-1971  Referred by: Dana Allan, MD Reason for referral : Care Coordination (Outreach to schedule with pharm d )   [name] is a 52 y.o. year old male who is a primary care patient of Dana Allan, MD. [name] was referred to the pharmacist for assistance related to DM.    Successful contact was made with the patient to discuss pharmacy services including being ready for the pharmacist to call at least 5 minutes before the scheduled appointment time, to have medication bottles and any blood sugar or blood pressure readings ready for review. The patient agreed to meet with the pharmacist via with the pharmacist via telephone visit on (date/time).  02/25/2023 Penne Lash, RMA Care Guide [facility]  [place], [place] [address] Direct Dial: [phone] [email or web address]

## 2023-02-25 ENCOUNTER — Other Ambulatory Visit: Payer: 59 | Admitting: Pharmacist

## 2023-03-05 ENCOUNTER — Other Ambulatory Visit: Payer: 59 | Admitting: Pharmacist

## 2023-03-05 ENCOUNTER — Telehealth: Payer: Self-pay | Admitting: Pharmacist

## 2023-03-05 NOTE — Progress Notes (Unsigned)
Attempted to contact patient for scheduled appointment for medication management. Left HIPAA compliant message for patient to return my call at their convenience.   Catie T. Harper, PharmD, BCACP, CPP Clinical Pharmacist [facility] [phone]  

## 2023-03-06 NOTE — Progress Notes (Signed)
Patient called and left me a voicemail. Called back, left voicemail for him to return my call at his convenience.   Catie Eppie Gibson, PharmD, BCACP, CPP Clinical Pharmacist [facility] [phone]

## 2023-03-08 ENCOUNTER — Telehealth: Payer: Self-pay

## 2023-03-08 NOTE — Progress Notes (Unsigned)
  Care Coordination Note  03/08/2023 Name: [name] MRN: [id number] DOB: March 16, 1971  [name] is a 52 y.o. year old male who is a primary care patient of Dana Allan, MD and is actively engaged with the Chronic Care Management team. I reached out to Doree Albee by phone today to assist with re-scheduling an initial visit with the Pharmacist  Follow up plan: Unsuccessful telephone outreach attempt made. A HIPAA compliant phone message was left for the patient providing contact information and requesting a return call.  If patient returns call to provider office, please advise to call CCM Care Guide Penne Lash  at [phone]  Penne Lash, RMA Care Guide [facility]  [place], [place] [address] Direct Dial: [phone] [email or web address]

## 2023-03-11 NOTE — Progress Notes (Signed)
   Care Guide Note  03/11/2023 Name: Colen Renee MRN: [id number] DOB: 29-Sep-1970  Referred by: Dana Allan, MD Reason for referral : Care Management (Outreach to reschedule with Pharm d )   Loran Wagenblast is a 52 y.o. year old male who is a primary care patient of Dana Allan, MD. Brailon Gojcaj was referred to the pharmacist for assistance related to DM.    Successful contact was made with the patient to discuss pharmacy services. Patient declines engagement at this time. Contact information was provided to the patient should they wish to reach out for assistance at a later time.  Penne Lash, RMA Care Guide [facility]  [place], [place] [address] Direct Dial: [phone] [email or web address]

## 2023-03-11 NOTE — Progress Notes (Signed)
   Care Guide Note  03/11/2023 Name: [name] MRN: [id number] DOB: 14-Jun-1971  Referred by: Dana Allan, MD Reason for referral : Care Management (Outreach to reschedule with Pharm d )   [name] is a 52 y.o. year old male who is a primary care patient of Dana Allan, MD. [name] was referred to the pharmacist for assistance related to HTN, HLD, and DM.    A second unsuccessful telephone outreach was attempted today to contact the patient who was referred to the pharmacy team for assistance with medication management. Additional attempts will be made to contact the patient.  Penne Lash, RMA Care Guide [facility]  [place], [place] [address] Direct Dial: [phone] [email or web address]

## 2023-03-18 ENCOUNTER — Telehealth: Payer: Self-pay

## 2023-03-18 NOTE — Progress Notes (Signed)
   Care Guide Note  03/18/2023 Name: [name] MRN: [id number] DOB: 1971-07-01  Referred by: Dana Allan, MD Reason for referral : Care Coordination (Outreach to schedule with Pharm d )   [name] is a 52 y.o. year old male who is a primary care patient of Dana Allan, MD. [name] was referred to the pharmacist for assistance related to DM.    An unsuccessful telephone outreach was attempted today to contact the patient who was referred to the pharmacy team for assistance with medication management. Additional attempts will be made to contact the patient.   Penne Lash, RMA Care Guide [facility]  [place], [place] [address] Direct Dial: [phone] [email or web address]

## 2023-03-21 ENCOUNTER — Ambulatory Visit: Payer: 59 | Admitting: Family Medicine

## 2023-03-21 ENCOUNTER — Encounter: Payer: Self-pay | Admitting: Family Medicine

## 2023-03-21 VITALS — BP 138/66 | HR 95 | Temp 98.2°F | Resp 16 | Ht 71.0 in | Wt 309.4 lb

## 2023-03-21 DIAGNOSIS — R059 Cough, unspecified: Secondary | ICD-10-CM

## 2023-03-21 DIAGNOSIS — E1159 Type 2 diabetes mellitus with other circulatory complications: Secondary | ICD-10-CM

## 2023-03-21 DIAGNOSIS — E1169 Type 2 diabetes mellitus with other specified complication: Secondary | ICD-10-CM

## 2023-03-21 DIAGNOSIS — E118 Type 2 diabetes mellitus with unspecified complications: Secondary | ICD-10-CM

## 2023-03-21 DIAGNOSIS — E785 Hyperlipidemia, unspecified: Secondary | ICD-10-CM

## 2023-03-21 DIAGNOSIS — Z7984 Long term (current) use of oral hypoglycemic drugs: Secondary | ICD-10-CM

## 2023-03-21 DIAGNOSIS — I152 Hypertension secondary to endocrine disorders: Secondary | ICD-10-CM

## 2023-03-21 LAB — POCT GLYCOSYLATED HEMOGLOBIN (HGB A1C): Hemoglobin A1C: 11.8 % — AB (ref 4.0–5.6)

## 2023-03-21 LAB — GLUCOSE, POCT (MANUAL RESULT ENTRY): POC Glucose: 396 mg/dl — AB (ref 70–99)

## 2023-03-21 MED ORDER — METFORMIN HCL ER 500 MG PO TB24
500.0000 mg | ORAL_TABLET | Freq: Two times a day (BID) | ORAL | 3 refills | Status: DC
Start: 2023-03-21 — End: 2023-06-10

## 2023-03-21 MED ORDER — RYBELSUS 3 MG PO TABS
3.0000 mg | ORAL_TABLET | Freq: Every day | ORAL | 0 refills | Status: DC
Start: 2023-03-21 — End: 2023-06-10

## 2023-03-21 NOTE — Patient Instructions (Addendum)
It was a pleasure meeting you today. Thank you for allowing me to take part in your health care.  Our goals for today as we discussed include:  Increase Metformin 500 mg two times a day for 1 week.  If no upset stomach increase to 2 tablets in the morning and 2 tablets in the evening for 1 week.  If continues to have no upset stomach increase Metformin to 4 tablets in morning and 4 tablets in evening  Start Rybelsus 3 mg daily.  Need to send in prior approval so start this when you get it  Continue Jardiance 25 mg daily Continue Glipizide 5 mg two times a day  Monitor glucose.  If continues to be > 400 go to the Emergency department  Follow up in 1 week  COVID test negative  If you have any questions or concerns, please do not hesitate to call the office at [phone].  I look forward to our next visit and until then take care and stay safe.  Regards,   Dana Allan, MD   [facility]

## 2023-03-21 NOTE — Progress Notes (Signed)
SUBJECTIVE:   Chief Complaint  Patient presents with   Diabetes   HPI Patient presents to clinic to follow-up chronic care management discussed recent labs  Cold like symptoms Cough for few weeks.  Nasal congestion and feeling more fatigues.  Denies any fevers, chest pain, shortness of breath, decrease in appetite, nausea/vomiting.  Hypertension Does not check BP at home.  Denies any headaches, chest pain, shortness of breath, or lower extremity swelling. Taking amlodipine 10 mg daily, chlorthalidone 25 mg daily and lisinopril 20 mg daily.     DM type II Denies any polyuria, polydipsia, polyphagia. Blood sugar at home 200's.  Jardiance 25 mg started at last visit.  Tolerating medication well.  Reports had stopped glipizide since restarted Metformin.  Discussed initiating Rybelsus today and agreeable.  Does not want injectable medications. Not adherent to diabetic diet  Hyperlipidemia Not interested in stating therapy.  Previously prescribed Lipitor 40 mg daily.    PERTINENT PMH / PSH: DM type II Morbid obesity Hypertension Hyperlipidemia OSA  OBJECTIVE:  BP 138/66   Pulse 95   Temp 98.2 F (36.8 C)   Resp 16   Ht 5\' 11"  (1.803 m)   Wt (!) 309 lb 6 oz (140.3 kg)   SpO2 98%   BMI 43.15 kg/m    Physical Exam Vitals reviewed.  Constitutional:      Appearance: He is obese.  HENT:     Head: Normocephalic.     Right Ear: Tympanic membrane, ear canal and external ear normal.     Left Ear: Tympanic membrane, ear canal and external ear normal.     Nose: Nose normal.     Mouth/Throat:     Mouth: Mucous membranes are moist.  Eyes:     Conjunctiva/sclera: Conjunctivae normal.     Pupils: Pupils are equal, round, and reactive to light.  Neck:     Thyroid: No thyromegaly or thyroid tenderness.     Vascular: No carotid bruit.  Cardiovascular:     Rate and Rhythm: Normal rate and regular rhythm.     Pulses: Normal pulses.     Heart sounds: Normal heart sounds.   Pulmonary:     Effort: Pulmonary effort is normal.     Breath sounds: Normal breath sounds.  Abdominal:     General: Abdomen is flat. Bowel sounds are normal.     Palpations: Abdomen is soft.  Musculoskeletal:        General: Normal range of motion.     Cervical back: Normal range of motion and neck supple.     Right lower leg: No edema.     Left lower leg: No edema.  Lymphadenopathy:     Cervical: No cervical adenopathy.  Neurological:     Mental Status: He is alert.  Psychiatric:        Mood and Affect: Mood normal.        Behavior: Behavior normal.        Thought Content: Thought content normal.        Judgment: Judgment normal.    ASSESSMENT/PLAN:  Diabetes mellitus type 2 with complications (HCC) Assessment & Plan: Chronic. CBG's at home 250-300.  Currently asymptomatic.  Previously tried metformin but unable to tolerate secondary to side effects.  Recent A1C 11.5.   Continue glipizide 5 mg twice daily Increase Metformin XR 500 mg BID Continue Jardiance 25 mg daily Start Rybelsus 3 mg daily Declined injectable medications, insulin, ozemoic, mounjaro CBG 396 Check CBC, Cmet today Recommend patient  go to ED for management of elevated blood glucose.  Declined ED evaluation.   On ACE, recommend statin therapy Refer to pharmacy for diabetic management and financial assistance with medications. Recommend diabetic eye exam.  Referral sent to ophthalmology Recommend annual foot exam.  Will complete at next visit Follow-up in 4 weeks  Orders: -     POCT glycosylated hemoglobin (Hb A1C) -     Comprehensive metabolic panel -     CBC -     Urinalysis, Routine w reflex microscopic -     POCT glucose (manual entry) -     Rybelsus; Take 1 tablet (3 mg total) by mouth daily.  Dispense: 30 tablet; Refill: 0 -     metFORMIN HCl ER; Take 1 tablet (500 mg total) by mouth 2 (two) times daily with a meal.  Dispense: 180 tablet; Refill: 3  Cough, unspecified type Assessment &  Plan: In no acute respiratory distress. Cough and nasal congestion for 1-2 weeks COVID negative Likely viral given lungs clear on exam Symptomatic treatment  Orders: -     POC COVID-19 BinaxNow  Hyperlipidemia associated with type 2 diabetes mellitus (HCC) Assessment & Plan: Chronic.  Not at goal for DM 2.   Recommend Crestor 20 mg daily, patient declined      Hypertension associated with diabetes (HCC) Assessment & Plan: Chronic.  Not currently at goal less than 130/80. Continue Norvasc 10 mg daily Continue lisinopril 20 mg to 2 tablets daily Continue Chlorthalidone 12.5 mg daily, plan to increase at next visit if not controlled Monitor blood pressure at home, goal less than 130/80.      HCM Diabetic foot exam at next visit Ophthalmology referral previously sent for diabetic eye exam Referral sent for colonoscopy Recommend shingles vaccine Recommend pneumonia 20 vaccine Hepatitis C/HIV screening completed.  Received PCV 23 in 2015 PSA screening completed   PDMP reviewed  Return in about 1 week (around 03/28/2023) for PCP.  Dana Allan, MD

## 2023-03-22 LAB — COMPREHENSIVE METABOLIC PANEL
ALT: 40 U/L (ref 0–53)
AST: 28 U/L (ref 0–37)
Albumin: 3.9 g/dL (ref 3.5–5.2)
Alkaline Phosphatase: 77 U/L (ref 39–117)
BUN: 15 mg/dL (ref 6–23)
CO2: 27 meq/L (ref 19–32)
Calcium: 9.6 mg/dL (ref 8.4–10.5)
Chloride: 96 mEq/L (ref 96–112)
Creatinine, Ser: 0.83 mg/dL (ref 0.40–1.50)
GFR: 99.43 mL/min (ref >60.00)
Glucose, Bld: 347 mg/dL — ABNORMAL HIGH (ref 70–99)
Potassium: 3.5 meq/L (ref 3.5–5.1)
Sodium: 134 meq/L — ABNORMAL LOW (ref 135–145)
Total Bilirubin: 0.4 mg/dL (ref 0.2–1.2)
Total Protein: 6.9 g/dL (ref 6.0–8.3)

## 2023-03-22 LAB — URINALYSIS, ROUTINE W REFLEX MICROSCOPIC
Bilirubin Urine: NEGATIVE
Hgb urine dipstick: NEGATIVE
Ketones, ur: NEGATIVE
Leukocytes,Ua: NEGATIVE
Nitrite: NEGATIVE
RBC / HPF: NONE SEEN (ref 0–?)
Specific Gravity, Urine: 1.01 (ref 1.000–1.030)
Total Protein, Urine: NEGATIVE
Urine Glucose: >=1000 — AB
Urobilinogen, UA: 0.2 (ref 0.0–1.0)
WBC, UA: NONE SEEN (ref 0–?)
pH: 5.5 (ref 5.0–8.0)

## 2023-03-22 LAB — CBC
HCT: 44 % (ref 39.0–52.0)
Hemoglobin: 14.6 g/dL (ref 13.0–17.0)
MCHC: 33.2 g/dL (ref 30.0–36.0)
MCV: 82.1 fl (ref 78.0–100.0)
Platelets: 311 10*3/uL (ref 150.0–400.0)
RBC: 5.36 Mil/uL (ref 4.22–5.81)
RDW: 14 % (ref 11.5–15.5)
WBC: 8.7 10*3/uL (ref 4.0–10.5)

## 2023-03-23 ENCOUNTER — Other Ambulatory Visit: Payer: Self-pay | Admitting: Family Medicine

## 2023-03-23 DIAGNOSIS — E1159 Type 2 diabetes mellitus with other circulatory complications: Secondary | ICD-10-CM

## 2023-03-26 ENCOUNTER — Encounter: Payer: Self-pay | Admitting: Family Medicine

## 2023-03-26 NOTE — Progress Notes (Signed)
   Care Guide Note  03/26/2023 Name: [name] MRN: [id number] DOB: [birth year]  Referred by: Dana Allan, MD Reason for referral : Care Coordination (Outreach to schedule with Pharm d )   [name] is a 52 y.o. year old male who is a primary care patient of Dana Allan, MD. [name] was referred to the pharmacist for assistance related to DM.    A second unsuccessful telephone outreach was attempted today to contact the patient who was referred to the pharmacy team for assistance with medication management. Additional attempts will be made to contact the patient.  Penne Lash, RMA Care Guide [facility]  [place], [place] [address] Direct Dial: [phone] [name]

## 2023-03-27 ENCOUNTER — Telehealth: Payer: Self-pay | Admitting: Family Medicine

## 2023-03-27 NOTE — Telephone Encounter (Signed)
Called and spoke to pt about lab results. 

## 2023-03-27 NOTE — Telephone Encounter (Signed)
Pt called in asking to speak to [facility]. Unable to transfer.

## 2023-03-28 ENCOUNTER — Ambulatory Visit: Payer: 59 | Admitting: Family Medicine

## 2023-04-09 NOTE — Progress Notes (Signed)
Care Guide Note  04/09/2023 Name: [name] MRN: [id number] DOB: 1970-11-06  Referred by: Dana Allan, MD Reason for referral : Care Coordination (Outreach to schedule with Pharm d )   [name] is a 52 y.o. year old male who is a primary care patient of Dana Allan, MD. [name] was referred to the pharmacist for assistance related to DM.    A third unsuccessful telephone outreach was attempted today to contact the patient who was referred to the pharmacy team for assistance with medication management. The Population Health team is pleased to engage with this patient at any time in the future upon receipt of referral and should he/she be interested in assistance from the [facility] team.   Penne Lash, RMA Care Guide [facility]  [place], [place] [address] Direct Dial: [phone] [email or web address]

## 2023-04-10 ENCOUNTER — Encounter: Payer: Self-pay | Admitting: Family Medicine

## 2023-04-10 DIAGNOSIS — R059 Cough, unspecified: Secondary | ICD-10-CM | POA: Insufficient documentation

## 2023-04-10 NOTE — Assessment & Plan Note (Signed)
Chronic.  Not currently at goal less than 130/80. Continue Norvasc 10 mg daily Continue lisinopril 20 mg to 2 tablets daily Continue Chlorthalidone 12.5 mg daily, plan to increase at next visit if not controlled Monitor blood pressure at home, goal less than 130/80.

## 2023-04-10 NOTE — Assessment & Plan Note (Addendum)
Chronic. CBG's at home 250-300.  Currently asymptomatic.  Previously tried metformin but unable to tolerate secondary to side effects.  Recent A1C 11.5.   Continue glipizide 5 mg twice daily Increase Metformin XR 500 mg BID Continue Jardiance 25 mg daily Start Rybelsus 3 mg daily Declined injectable medications, insulin, ozemoic, mounjaro CBG 396 Check CBC, Cmet today Recommend patient go to ED for management of elevated blood glucose.  Declined ED evaluation.   On ACE, recommend statin therapy Refer to pharmacy for diabetic management and financial assistance with medications. Recommend diabetic eye exam.  Referral sent to ophthalmology Recommend annual foot exam.  Will complete at next visit Follow-up in 4 weeks

## 2023-04-10 NOTE — Assessment & Plan Note (Signed)
Chronic.  Not at goal for DM 2.   Recommend Crestor 20 mg daily, patient declined

## 2023-04-10 NOTE — Assessment & Plan Note (Signed)
In no acute respiratory distress. Cough and nasal congestion for 1-2 weeks COVID negative Likely viral given lungs clear on exam Symptomatic treatment

## 2023-04-11 ENCOUNTER — Ambulatory Visit: Payer: 59 | Admitting: Family Medicine

## 2023-04-26 ENCOUNTER — Ambulatory Visit: Payer: 59 | Admitting: Family Medicine

## 2023-05-15 ENCOUNTER — Other Ambulatory Visit: Payer: Self-pay | Admitting: Family Medicine

## 2023-05-15 DIAGNOSIS — E1159 Type 2 diabetes mellitus with other circulatory complications: Secondary | ICD-10-CM

## 2023-06-10 ENCOUNTER — Ambulatory Visit: Payer: 59 | Admitting: Family Medicine

## 2023-06-10 ENCOUNTER — Encounter: Payer: Self-pay | Admitting: Family Medicine

## 2023-06-10 ENCOUNTER — Ambulatory Visit (INDEPENDENT_AMBULATORY_CARE_PROVIDER_SITE_OTHER): Payer: 59

## 2023-06-10 VITALS — BP 160/90 | HR 87 | Temp 97.2°F | Resp 17 | Ht 71.0 in | Wt 312.5 lb

## 2023-06-10 DIAGNOSIS — I152 Hypertension secondary to endocrine disorders: Secondary | ICD-10-CM

## 2023-06-10 DIAGNOSIS — L97521 Non-pressure chronic ulcer of other part of left foot limited to breakdown of skin: Secondary | ICD-10-CM

## 2023-06-10 DIAGNOSIS — E11621 Type 2 diabetes mellitus with foot ulcer: Secondary | ICD-10-CM

## 2023-06-10 DIAGNOSIS — E118 Type 2 diabetes mellitus with unspecified complications: Secondary | ICD-10-CM | POA: Diagnosis not present

## 2023-06-10 DIAGNOSIS — E1159 Type 2 diabetes mellitus with other circulatory complications: Secondary | ICD-10-CM | POA: Diagnosis not present

## 2023-06-10 DIAGNOSIS — Z7984 Long term (current) use of oral hypoglycemic drugs: Secondary | ICD-10-CM

## 2023-06-10 MED ORDER — GLIPIZIDE 5 MG PO TABS: 5.0000 mg | ORAL_TABLET | Freq: Two times a day (BID) | ORAL | 3 refills | Status: DC

## 2023-06-10 MED ORDER — CHLORTHALIDONE 25 MG PO TABS
12.5000 mg | ORAL_TABLET | Freq: Every day | ORAL | 2 refills | Status: DC
Start: 2023-06-10 — End: 2023-07-05

## 2023-06-10 MED ORDER — AMLODIPINE BESYLATE 10 MG PO TABS
10.0000 mg | ORAL_TABLET | Freq: Every day | ORAL | 0 refills | Status: DC
Start: 2023-06-10 — End: 2023-07-05

## 2023-06-10 MED ORDER — SULFAMETHOXAZOLE-TRIMETHOPRIM 800-160 MG PO TABS: 1.0000 | ORAL_TABLET | Freq: Two times a day (BID) | ORAL | 0 refills | Status: AC

## 2023-06-10 MED ORDER — METFORMIN HCL ER 500 MG PO TB24
500.0000 mg | ORAL_TABLET | Freq: Two times a day (BID) | ORAL | 3 refills | Status: DC
Start: 2023-06-10 — End: 2023-10-21

## 2023-06-10 MED ORDER — RYBELSUS 3 MG PO TABS: 3.0000 mg | ORAL_TABLET | Freq: Every day | ORAL | 0 refills | Status: DC

## 2023-06-10 MED ORDER — LISINOPRIL 20 MG PO TABS: 40.0000 mg | ORAL_TABLET | Freq: Every evening | ORAL | 3 refills | Status: DC

## 2023-06-10 MED ORDER — EMPAGLIFLOZIN 25 MG PO TABS: 25.0000 mg | ORAL_TABLET | Freq: Every day | ORAL | 3 refills | Status: DC

## 2023-06-10 NOTE — Patient Instructions (Addendum)
It was a pleasure meeting you today. Thank you for allowing me to take part in your health care.  Our goals for today as we discussed include:  Foot xray today Start Bactrim 1 tablet two times a day for 10 days Start Probiotics daily while on antibiotics.  This is over the counter. Referral sent to Podiatry  We will get some labs today.  If they are abnormal or we need to do something about them, I will call you.  If they are normal, I will send you a message on MyChart (if it is active) or a letter in the mail.  If you don't hear from [place] in 2 weeks, please call the office at the number below.   Refills sent for requested medications  It is important to take medications as prescribed  Follow up in 3 days   This is a list of the screening recommended for you and due dates:  Health Maintenance  Topic Date Due   Eye exam for diabetics  Never done   Zoster (Shingles) Vaccine (1 of 2) Never done   Flu Shot  10/21/2023*   Colon Cancer Screening  03/20/2024*   Hemoglobin A1C  09/20/2023   Yearly kidney health urinalysis for diabetes  12/14/2023   Yearly kidney function blood test for diabetes  03/20/2024   Complete foot exam   06/09/2024   DTaP/Tdap/Td vaccine (2 - Td or Tdap) 08/13/2029   Hepatitis C Screening  Completed   HIV Screening  Completed   HPV Vaccine  Aged Out   COVID-19 Vaccine  Discontinued  *Topic was postponed. The date shown is not the original due date.    If you have any questions or concerns, please do not hesitate to call the office at [phone].  I look forward to our next visit and until then take care and stay safe.  Regards,   Dana Allan, MD   [facility]

## 2023-06-10 NOTE — Progress Notes (Signed)
SUBJECTIVE:   Chief Complaint  Patient presents with   Medical Management of Chronic Issues    Follow-up   HPI Presents to clinic for follow up chronic disease management  Discussed the use of AI scribe software for clinical note transcription with the patient, who gave verbal consent to proceed.  History of Present Illness The patient, with a history of hypertension and diabetes, presents with concerns about fluctuating blood pressure. They report that their blood pressure has been high recently, but also experiences episodes of low blood pressure, to the point of functional impairment. They have been taking their prescribed medications, including amlodipine, chlorthalidone, and lisinopril, but not as directed. They take amlodipine in the evening to avoid hypotension during the day, and have been taking only one tablet of lisinopril instead of the prescribed two. They also report inconsistent adherence to their diabetes medication, Jardiance, due to the burden of pill intake.  The patient also reports nocturia, which they attribute to their diabetes. They have been experiencing this symptom alongside an increase in urinary frequency, which has been disruptive to their sleep.  In addition to these concerns, the patient presents with a foot condition that started as blisters on  two toes of left foot about a week and a half ago. The skin has since torn off, and there is some swelling and an odor present. The patient reports minimal pain and reduced sensation in the affected area, suggesting possible neuropathy. They have not seen a foot doctor for this issue.   PERTINENT PMH / PSH: As above  OBJECTIVE:  BP (!) 160/90 (BP Location: Left Arm, Patient Position: Sitting, Cuff Size: Large)   Pulse 87   Temp (!) 97.2 F (36.2 C) (Oral)   Resp 17   Ht 5\' 11"  (1.803 m)   Wt (!) 312 lb 8 oz (141.7 kg)   SpO2 97%   BMI 43.58 kg/m    Physical Exam Constitutional:      General: He is not  in acute distress.    Appearance: He is obese. He is not ill-appearing.  HENT:     Head: Normocephalic.  Eyes:     Conjunctiva/sclera: Conjunctivae normal.  Cardiovascular:     Rate and Rhythm: Normal rate and regular rhythm.     Pulses: Normal pulses.  Pulmonary:     Effort: Pulmonary effort is normal.     Breath sounds: Normal breath sounds.  Abdominal:     General: Bowel sounds are normal.  Neurological:     Mental Status: He is alert. Mental status is at baseline.  Psychiatric:        Mood and Affect: Mood normal.        Behavior: Behavior normal.        Thought Content: Thought content normal.        Judgment: Judgment normal.   Left foot ulcer: foul smelling with small amount of purulent discharge, erythema, warmth and tender to touch, increase swelling second metatarsal         06/10/2023    2:34 PM 03/21/2023    3:54 PM 12/03/2022    3:56 PM  Depression screen PHQ 2/9  Decreased Interest 0 0 0  Down, Depressed, Hopeless 0 0 0  PHQ - 2 Score 0 0 0  Altered sleeping 0 0 0  Tired, decreased energy 0 0 0  Change in appetite 0 0 2  Feeling bad or failure about yourself  0 0 0  Trouble concentrating 0 0 0  Moving slowly or fidgety/restless 0 0 0  Suicidal thoughts 0 0 0  PHQ-9 Score 0 0 2  Difficult doing work/chores Not difficult at all Not difficult at all Not difficult at all      06/10/2023    2:34 PM 03/21/2023    3:55 PM 12/03/2022    3:56 PM  GAD 7 : Generalized Anxiety Score  Nervous, Anxious, on Edge 0 0 0  Control/stop worrying 0 0 0  Worry too much - different things 0 0 0  Trouble relaxing 0 0 0  Restless 0 0 0  Easily annoyed or irritable 0 0 0  Afraid - awful might happen 0 0 0  Total GAD 7 Score 0 0 0  Anxiety Difficulty Not difficult at all Not difficult at all Not difficult at all    ASSESSMENT/PLAN:  Hypertension associated with diabetes [facility]) Assessment & Plan: Uncontrolled with inconsistent medication use. Patient reports  fluctuating blood pressures at home. Currently on Lisinopril 20mg ,prescribed 40 mg, Amlodipine 10mg , and Chlorthalidone 12.5mg . -Increase Lisinopril to 40mg  daily as originally prescribed. -Refill Amlodipine 10mg  at night and Chlorthalidone 12.5mg  in the morning. -Encourage consistent medication use and home blood pressure monitoring. -Follow up in 4 days  Orders: -     amLODIPine Besylate; Take 1 tablet (10 mg total) by mouth daily.  Dispense: 30 tablet; Refill: 0 -     Chlorthalidone; Take 0.5 tablets (12.5 mg total) by mouth daily.  Dispense: 30 tablet; Refill: 2 -     Lisinopril; Take 2 tablets (40 mg total) by mouth at bedtime.  Dispense: 60 tablet; Refill: 3 -     Comprehensive metabolic panel  Diabetes mellitus type 2 with complications (HCC) Assessment & Plan: Uncontrolled with inconsistent medication use. Patient reports not taking Jardiance 25mg  daily as prescribed. -Encourage daily use of Jardiance 25mg  daily, refill sent -Refill Glipizide 5 mg BID -Refill Metformin XR 500 mg BID, can consider increasing once tolerating Rybelsus -Start Rybelsus 3 mg daily, increase to 7 mg daily in 4 weeks pending tolerability -Patient not interested in injectable medication, insulin, ozempic or mounjaro -Order HbA1c and renal function tests. -Foot exam positive for neuropathy, referral sent to Podiatry for detailed evaluation   Orders: -     metFORMIN HCl ER; Take 1 tablet (500 mg total) by mouth 2 (two) times daily with a meal.  Dispense: 180 tablet; Refill: 3 -     glipiZIDE; Take 1 tablet (5 mg total) by mouth 2 (two) times daily.  Dispense: 60 tablet; Refill: 3 -     Empagliflozin; Take 1 tablet (25 mg total) by mouth daily before breakfast.  Dispense: 30 tablet; Refill: 3 -     Rybelsus; Take 1 tablet (3 mg total) by mouth daily.  Dispense: 30 tablet; Refill: 0 -     Hemoglobin A1c -     Comprehensive metabolic panel  Diabetic ulcer of toe of left foot associated with type 2 diabetes  mellitus, limited to breakdown of skin (HCC) Assessment & Plan: New left foot ulcer with possible infection. Patient reports onset 1.5 weeks ago following blister formation. No reported fever. Noted swelling and odor. -Order foot X-ray to assess for osteomyelitis. -Start empiric antibiotic therapy. -Refer to podiatry for further evaluation and management.  Orders: -     CBC with Differential/Platelet -     DG Foot Complete Left; Future -     Ambulatory referral to Podiatry -     WOUND CULTURE -  Sulfamethoxazole-Trimethoprim; Take 1 tablet by mouth 2 (two) times daily for 10 days.  Dispense: 20 tablet; Refill: 0    PDMP reviewed  Return in about 4 days (around 06/14/2023) for PCP, diabetic wound.  Dana Allan, MD

## 2023-06-11 ENCOUNTER — Telehealth: Payer: Self-pay

## 2023-06-11 LAB — CBC WITH DIFFERENTIAL/PLATELET
Basophils Absolute: 0.1 10*3/uL (ref 0.0–0.1)
Basophils Relative: 1.2 % (ref 0.0–3.0)
Eosinophils Absolute: 0.3 10*3/uL (ref 0.0–0.7)
Eosinophils Relative: 3.5 % (ref 0.0–5.0)
HCT: 42.3 % (ref 39.0–52.0)
Hemoglobin: 14.5 g/dL (ref 13.0–17.0)
Lymphocytes Relative: 38.6 % (ref 12.0–46.0)
Lymphs Abs: 3.4 10*3/uL (ref 0.7–4.0)
MCHC: 34.2 g/dL (ref 30.0–36.0)
MCV: 80.9 fL (ref 78.0–100.0)
Monocytes Absolute: 0.5 10*3/uL (ref 0.1–1.0)
Monocytes Relative: 6.1 % (ref 3.0–12.0)
Neutro Abs: 4.5 10*3/uL (ref 1.4–7.7)
Neutrophils Relative %: 50.6 % (ref 43.0–77.0)
Platelets: 403 10*3/uL — ABNORMAL HIGH (ref 150.0–400.0)
RBC: 5.23 Mil/uL (ref 4.22–5.81)
RDW: 13.7 % (ref 11.5–15.5)
WBC: 8.9 10*3/uL (ref 4.0–10.5)

## 2023-06-11 LAB — HEMOGLOBIN A1C: Hgb A1c MFr Bld: 12.8 % — ABNORMAL HIGH (ref 4.6–6.5)

## 2023-06-11 LAB — COMPREHENSIVE METABOLIC PANEL
ALT: 33 U/L (ref 0–53)
AST: 22 U/L (ref 0–37)
Albumin: 3.9 g/dL (ref 3.5–5.2)
Alkaline Phosphatase: 85 U/L (ref 39–117)
BUN: 14 mg/dL (ref 6–23)
CO2: 28 meq/L (ref 19–32)
Calcium: 9.3 mg/dL (ref 8.4–10.5)
Chloride: 92 meq/L — ABNORMAL LOW (ref 96–112)
Creatinine, Ser: 0.82 mg/dL (ref 0.40–1.50)
GFR: 99.63 mL/min (ref >60.00)
Glucose, Bld: 298 mg/dL — ABNORMAL HIGH (ref 70–99)
Potassium: 3.7 meq/L (ref 3.5–5.1)
Sodium: 131 meq/L — ABNORMAL LOW (ref 135–145)
Total Bilirubin: 0.4 mg/dL (ref 0.2–1.2)
Total Protein: 7.3 g/dL (ref 6.0–8.3)

## 2023-06-11 NOTE — Telephone Encounter (Signed)
-----   Message from Dana Allan sent at 06/11/2023  1:11 PM EST ----- No infection in bone Heel spur noted.

## 2023-06-11 NOTE — Telephone Encounter (Signed)
Patient states he is returning our call.  I read Dr. Astrid Divine message to patient.

## 2023-06-11 NOTE — Telephone Encounter (Signed)
Lvm for pt to give office a call in regards to imaging

## 2023-06-11 NOTE — Telephone Encounter (Signed)
noted 

## 2023-06-13 LAB — WOUND CULTURE
MICRO NUMBER:: 15743880
SPECIMEN QUALITY:: ADEQUATE

## 2023-06-14 ENCOUNTER — Ambulatory Visit: Payer: 59 | Admitting: Family Medicine

## 2023-06-19 ENCOUNTER — Encounter: Payer: Self-pay | Admitting: Family Medicine

## 2023-06-19 DIAGNOSIS — E11621 Type 2 diabetes mellitus with foot ulcer: Secondary | ICD-10-CM | POA: Insufficient documentation

## 2023-06-19 DIAGNOSIS — E118 Type 2 diabetes mellitus with unspecified complications: Secondary | ICD-10-CM | POA: Insufficient documentation

## 2023-06-19 NOTE — Assessment & Plan Note (Addendum)
Uncontrolled with inconsistent medication use. Patient reports fluctuating blood pressures at home. Currently on Lisinopril 20mg ,prescribed 40 mg, Amlodipine 10mg , and Chlorthalidone 12.5mg . -Increase Lisinopril to 40mg  daily as originally prescribed. -Refill Amlodipine 10mg  at night and Chlorthalidone 12.5mg  in the morning. -Encourage consistent medication use and home blood pressure monitoring. -Follow up in 4 days

## 2023-06-19 NOTE — Assessment & Plan Note (Signed)
New left foot ulcer with possible infection. Patient reports onset 1.5 weeks ago following blister formation. No reported fever. Noted swelling and odor. -Order foot X-ray to assess for osteomyelitis. -Start empiric antibiotic therapy. -Refer to podiatry for further evaluation and management.

## 2023-06-19 NOTE — Assessment & Plan Note (Addendum)
Uncontrolled with inconsistent medication use. Patient reports not taking Jardiance 25mg  daily as prescribed. -Encourage daily use of Jardiance 25mg  daily, refill sent -Refill Glipizide 5 mg BID -Refill Metformin XR 500 mg BID, can consider increasing once tolerating Rybelsus -Start Rybelsus 3 mg daily, increase to 7 mg daily in 4 weeks pending tolerability -Patient not interested in injectable medication, insulin, ozempic or mounjaro -Order HbA1c and renal function tests. -Foot exam positive for neuropathy, referral sent to Podiatry for detailed evaluation

## 2023-06-25 ENCOUNTER — Telehealth: Payer: Self-pay

## 2023-06-25 NOTE — Telephone Encounter (Signed)
Lvm for pt to give office a call back in regards to results, see message below

## 2023-06-25 NOTE — Telephone Encounter (Signed)
-----   Message from Dana Allan sent at 06/19/2023  9:30 PM EST ----- A1c 12.8. Significantly uncontrolled diabetes. Please check to see if patient has start Rybelsus and restarted Jardiance. He should also be taking Metformin 500 mg two times a day and Glipizide 5 mg two times a day  Left toe showed infection.  Treated with Bactrim.  Please ask patient if improving. If not will add different antibiotic  Blood pressure was elevated at last visit.  Check if he was able to get BP at home as he had cancelled his follow up appointment. If not please schedule in for RN visit, BP check and check wound.  All other blood work acceptable.

## 2023-06-26 ENCOUNTER — Telehealth: Payer: Self-pay

## 2023-06-26 NOTE — Telephone Encounter (Signed)
-----   Message from Dana Allan sent at 06/19/2023  9:30 PM EST ----- A1c 12.8. Significantly uncontrolled diabetes. Please check to see if patient has start Rybelsus and restarted Jardiance. He should also be taking Metformin 500 mg two times a day and Glipizide 5 mg two times a day  Left toe showed infection.  Treated with Bactrim.  Please ask patient if improving. If not will add different antibiotic  Blood pressure was elevated at last visit.  Check if he was able to get BP at home as he had cancelled his follow up appointment. If not please schedule in for RN visit, BP check and check wound.  All other blood work acceptable.

## 2023-06-26 NOTE — Telephone Encounter (Signed)
Lvm for pt to give office a call back in regards to labs

## 2023-07-01 ENCOUNTER — Ambulatory Visit: Payer: 59

## 2023-07-05 ENCOUNTER — Encounter: Payer: Self-pay | Admitting: Family Medicine

## 2023-07-05 ENCOUNTER — Ambulatory Visit: Payer: 59 | Admitting: Family Medicine

## 2023-07-05 VITALS — BP 154/80 | HR 94 | Temp 98.0°F | Resp 18 | Ht 72.0 in | Wt 321.1 lb

## 2023-07-05 DIAGNOSIS — E1159 Type 2 diabetes mellitus with other circulatory complications: Secondary | ICD-10-CM

## 2023-07-05 DIAGNOSIS — E11621 Type 2 diabetes mellitus with foot ulcer: Secondary | ICD-10-CM

## 2023-07-05 DIAGNOSIS — E118 Type 2 diabetes mellitus with unspecified complications: Secondary | ICD-10-CM | POA: Diagnosis not present

## 2023-07-05 DIAGNOSIS — L97521 Non-pressure chronic ulcer of other part of left foot limited to breakdown of skin: Secondary | ICD-10-CM

## 2023-07-05 DIAGNOSIS — I152 Hypertension secondary to endocrine disorders: Secondary | ICD-10-CM

## 2023-07-05 DIAGNOSIS — Z7984 Long term (current) use of oral hypoglycemic drugs: Secondary | ICD-10-CM

## 2023-07-05 MED ORDER — CHLORTHALIDONE 25 MG PO TABS: 25.0000 mg | ORAL_TABLET | Freq: Every day | ORAL | 2 refills | Status: DC

## 2023-07-05 MED ORDER — EMPAGLIFLOZIN 25 MG PO TABS: 25.0000 mg | ORAL_TABLET | Freq: Every day | ORAL | 3 refills | Status: DC

## 2023-07-05 MED ORDER — GLIPIZIDE 5 MG PO TABS: 5.0000 mg | ORAL_TABLET | Freq: Two times a day (BID) | ORAL | 3 refills | Status: DC

## 2023-07-05 MED ORDER — RYBELSUS 7 MG PO TABS: 7.0000 mg | ORAL_TABLET | Freq: Every day | ORAL | Status: DC

## 2023-07-05 MED ORDER — AMLODIPINE BESYLATE 10 MG PO TABS: 10.0000 mg | ORAL_TABLET | Freq: Every day | ORAL | 0 refills | Status: DC

## 2023-07-05 NOTE — Progress Notes (Unsigned)
SUBJECTIVE:   Chief Complaint  Patient presents with   Hypertension   Wound Check   HPI Presents to clinic for follow up chronic disease management and diabetic wound  Discussed the use of AI scribe software for clinical note transcription with the patient, who gave verbal consent to proceed.  History of Present Illness The patient, with a history of diabetes and hypertension, presents with a concern about his foot. He denies any drainage from the foot and reports that the bandage has been off for a couple of weeks. Despite being constantly on his feet due to work, he denies any significant pain. He did, however, experience a bit of discomfort the previous day, which he attributes to his work activities. Upon examination, he notes the presence of what appears to be a significant amount of dead skin on the foot, which was not present before. He admits to not soaking the foot regularly due to time constraints.  The patient's diabetes management appears to be suboptimal. He admits to not regularly checking his blood sugars at home and acknowledges that his diet is not ideal. He has been taking Rybelsus, but admits to skipping a few doses. He also reports taking Jardiance and Glipizide regularly.  Regarding his hypertension, the patient admits to not taking his blood pressure at home. He has been taking Lisinopril and recently increased the dose from one to two tablets. He also reports taking Chlorthalidone, but it is unclear whether he has been taking a half or a full tablet. He took a full tablet on the day of the consultation.  The patient denies any numbness or tingling in his feet. He expresses concern about the potential for infection in his foot, but denies any signs of infection such as fever or drainage. He acknowledges the importance of better managing his blood pressure and blood sugars to prevent complications such as a potential foot infection.    PERTINENT PMH / PSH: As  above  OBJECTIVE:  BP (!) 154/80   Pulse 94   Temp 98 F (36.7 C) (Oral)   Resp 18   Ht 6' (1.829 m)   Wt (!) 321 lb 2 oz (145.7 kg)   SpO2 97%   BMI 43.55 kg/m    Physical Exam Vitals reviewed.  Constitutional:      General: He is not in acute distress.    Appearance: Normal appearance. He is obese. He is not ill-appearing, toxic-appearing or diaphoretic.  Eyes:     General:        Right eye: No discharge.        Left eye: No discharge.  Cardiovascular:     Rate and Rhythm: Normal rate and regular rhythm.     Heart sounds: Normal heart sounds.  Pulmonary:     Effort: Pulmonary effort is normal.     Breath sounds: Normal breath sounds.  Abdominal:     General: Bowel sounds are normal.  Musculoskeletal:        General: Normal range of motion.     Cervical back: Normal range of motion.  Feet:     Comments: Left second toe crusted yellow callus, without discharge, or open wound.  Slight erythema and edema but improved since last visit Skin:    General: Skin is warm and dry.  Neurological:     Mental Status: He is alert and oriented to person, place, and time. Mental status is at baseline.  Psychiatric:        Mood and  Affect: Mood normal.        Behavior: Behavior normal.        Thought Content: Thought content normal.        Judgment: Judgment normal.        07/05/2023    2:52 PM 06/10/2023    2:34 PM 03/21/2023    3:54 PM 12/03/2022    3:56 PM  Depression screen PHQ 2/9  Decreased Interest 0 0 0 0  Down, Depressed, Hopeless 0 0 0 0  PHQ - 2 Score 0 0 0 0  Altered sleeping 0 0 0 0  Tired, decreased energy 0 0 0 0  Change in appetite 0 0 0 2  Feeling bad or failure about yourself  0 0 0 0  Trouble concentrating 0 0 0 0  Moving slowly or fidgety/restless 0 0 0 0  Suicidal thoughts 0 0 0 0  PHQ-9 Score 0 0 0 2  Difficult doing work/chores Not difficult at all Not difficult at all Not difficult at all Not difficult at all      07/05/2023    2:52 PM  06/10/2023    2:34 PM 03/21/2023    3:55 PM 12/03/2022    3:56 PM  GAD 7 : Generalized Anxiety Score  Nervous, Anxious, on Edge 0 0 0 0  Control/stop worrying 0 0 0 0  Worry too much - different things 0 0 0 0  Trouble relaxing 0 0 0 0  Restless 0 0 0 0  Easily annoyed or irritable 0 0 0 0  Afraid - awful might happen 0 0 0 0  Total GAD 7 Score 0 0 0 0  Anxiety Difficulty Not difficult at all Not difficult at all Not difficult at all Not difficult at all    ASSESSMENT/PLAN:  Diabetic ulcer of toe of left foot associated with type 2 diabetes mellitus, limited to breakdown of skin (HCC) Assessment & Plan: Healing with no signs of infection. No drainage, fever, or significant pain. Patient is not soaking the foot regularly. Completed course of antibiotics -Encouraged patient to monitor for signs of infection such as increased pain or drainage. -Check on the status of the podiatry consult.   Hypertension associated with diabetes (HCC) Assessment & Plan: Blood pressure still high despite taking Lisinopril and Chlorthalidone. Patient recently increased Chlorthalidone to full tablet. -Continue Chlorthalidone 25 mg daily -If blood pressure does not decrease in the next week, plan to add Spironolactone 12.5mg  daily. -Encouraged patient to monitor blood pressure at home.   Orders: -     amLODIPine Besylate; Take 1 tablet (10 mg total) by mouth daily.  Dispense: 30 tablet; Refill: 0 -     Chlorthalidone; Take 1 tablet (25 mg total) by mouth daily.  Dispense: 30 tablet; Refill: 2  Diabetes mellitus type 2 with complications (HCC) Assessment & Plan: Last A1C was 12.8. Patient is not checking blood sugars at home. Currently on Rybelsus, Jardiance 25mg , and Glipizide. -Increase Rybelsus to 7mg  daily. -Encouraged patient to monitor blood sugars at home. -Plan to decrease Glipizide once full dose of Rybelsus is reached.  Orders: -     Empagliflozin; Take 1 tablet (25 mg total) by mouth daily  before breakfast.  Dispense: 30 tablet; Refill: 3 -     glipiZIDE; Take 1 tablet (5 mg total) by mouth 2 (two) times daily.  Dispense: 60 tablet; Refill: 3 -     Rybelsus; Take 1 tablet (7 mg total) by mouth daily.  Morbid obesity (HCC)  Assessment & Plan: Chronic.  BMI greater than 40 with serious comorbidities, hypertension, type 2 diabetes uncontrolled, hyperlipidemia Increase Rybelsus 7 mg daily    PDMP reviewed  Return in about 4 weeks (around 08/02/2023) for PCP, DM, HTN.  Dana Allan, MD

## 2023-07-05 NOTE — Patient Instructions (Addendum)
It was a pleasure meeting you today. Thank you for allowing me to take part in your health care.  Our goals for today as we discussed include:  Blood pressure high Increase Chlorthalidone to 25 mg daily Continue all other blood pressure medications at current doses Check blood pressure at home.  Goal < 140/90  Stop Rybelsus 3 mg Start Rybelsus 7 mg daily.  Sample provided, 30 tablets.  Plan to increase to 14 mg at next visit if tolerating. Continue all other diabetic medication  Call [facility] to follow up with appointment for foot wound Address: [address] [place], [place] [address] Phone: [phone]  This is a list of the screening recommended for you and due dates:  Health Maintenance  Topic Date Due   Eye exam for diabetics  Never done   Zoster (Shingles) Vaccine (1 of 2) Never done   Colon Cancer Screening  03/20/2024*   Hemoglobin A1C  12/08/2023   Yearly kidney health urinalysis for diabetes  12/14/2023   Yearly kidney function blood test for diabetes  06/09/2024   Complete foot exam   06/09/2024   DTaP/Tdap/Td vaccine (2 - Td or Tdap) 08/13/2029   Flu Shot  Completed   Hepatitis C Screening  Completed   HIV Screening  Completed   HPV Vaccine  Aged Out   COVID-19 Vaccine  Discontinued  *Topic was postponed. The date shown is not the original due date.     If you have any questions or concerns, please do not hesitate to call the office at [phone].  I look forward to our next visit and until then take care and stay safe.  Regards,   Dana Allan, MD   [facility]

## 2023-07-05 NOTE — Progress Notes (Unsigned)
Medication Samples have been provided to the patient.  Drug name: Rybelsus Strength: 7 MG    Qty: 1 box LOT: [id number]  Exp.Date: 05/22/2024  Dosing instructions: 1 tablet daily  The patient has been instructed regarding the correct time, dose, and frequency of taking this medication, including desired effects and most common side effects.   Tresa Endo  Tedd Cottrill 3:09 PM 07/05/2023

## 2023-07-09 ENCOUNTER — Encounter: Payer: Self-pay | Admitting: Family Medicine

## 2023-07-09 NOTE — Assessment & Plan Note (Signed)
Chronic.  BMI greater than 40 with serious comorbidities, hypertension, type 2 diabetes uncontrolled, hyperlipidemia Increase Rybelsus 7 mg daily

## 2023-07-09 NOTE — Assessment & Plan Note (Signed)
Healing with no signs of infection. No drainage, fever, or significant pain. Patient is not soaking the foot regularly. Completed course of antibiotics -Encouraged patient to monitor for signs of infection such as increased pain or drainage. -Check on the status of the podiatry consult.

## 2023-07-09 NOTE — Assessment & Plan Note (Signed)
Last A1C was 12.8. Patient is not checking blood sugars at home. Currently on Rybelsus, Jardiance 25mg , and Glipizide. -Increase Rybelsus to 7mg  daily. -Encouraged patient to monitor blood sugars at home. -Plan to decrease Glipizide once full dose of Rybelsus is reached.

## 2023-07-09 NOTE — Assessment & Plan Note (Addendum)
Blood pressure still high despite taking Lisinopril and Chlorthalidone. Patient recently increased Chlorthalidone to full tablet. -Continue Chlorthalidone 25 mg daily -If blood pressure does not decrease in the next week, plan to add Spironolactone 12.5mg  daily. -Encouraged patient to monitor blood pressure at home.

## 2023-07-09 NOTE — Assessment & Plan Note (Signed)
>>  ASSESSMENT AND PLAN FOR OBESITY, CLASS III, BMI 40-49.9 (MORBID OBESITY) (HCC) WRITTEN ON 07/09/2023  9:58 AM BY WALSH, TANYA, MD  Chronic.  BMI greater than 40 with serious comorbidities, hypertension, type 2 diabetes uncontrolled, hyperlipidemia Increase Rybelsus  7 mg daily

## 2023-08-05 ENCOUNTER — Ambulatory Visit: Payer: 59 | Admitting: Family Medicine

## 2023-08-05 ENCOUNTER — Encounter: Payer: Self-pay | Admitting: Family Medicine

## 2023-08-05 VITALS — BP 138/84 | HR 87 | Temp 98.0°F | Resp 18 | Ht 72.0 in | Wt 319.2 lb

## 2023-08-05 DIAGNOSIS — Z7984 Long term (current) use of oral hypoglycemic drugs: Secondary | ICD-10-CM

## 2023-08-05 DIAGNOSIS — L97521 Non-pressure chronic ulcer of other part of left foot limited to breakdown of skin: Secondary | ICD-10-CM

## 2023-08-05 DIAGNOSIS — E1159 Type 2 diabetes mellitus with other circulatory complications: Secondary | ICD-10-CM

## 2023-08-05 DIAGNOSIS — I152 Hypertension secondary to endocrine disorders: Secondary | ICD-10-CM

## 2023-08-05 DIAGNOSIS — E11621 Type 2 diabetes mellitus with foot ulcer: Secondary | ICD-10-CM | POA: Diagnosis not present

## 2023-08-05 DIAGNOSIS — E118 Type 2 diabetes mellitus with unspecified complications: Secondary | ICD-10-CM | POA: Diagnosis not present

## 2023-08-05 MED ORDER — RYBELSUS 14 MG PO TABS: 14.0000 mg | ORAL_TABLET | Freq: Every day | ORAL | 6 refills | Status: DC

## 2023-08-05 MED ORDER — AMLODIPINE BESYLATE 10 MG PO TABS: 10.0000 mg | ORAL_TABLET | Freq: Every day | ORAL | 6 refills | Status: DC

## 2023-08-05 MED ORDER — CHLORTHALIDONE 25 MG PO TABS: 25.0000 mg | ORAL_TABLET | Freq: Every day | ORAL | 6 refills | Status: DC

## 2023-08-05 MED ORDER — EMPAGLIFLOZIN 25 MG PO TABS: 25.0000 mg | ORAL_TABLET | Freq: Every day | ORAL | 6 refills | Status: DC

## 2023-08-05 NOTE — Patient Instructions (Addendum)
 It was a pleasure meeting you today. Thank you for allowing me to take part in your health care.  Our goals for today as we discussed include:  Increase Rybelsus  to 14 mg daily Take 2 of the 7 mg tablets daily.  Once finished can get refill for the 14 mg tablets.  Prescriprion already sent to pharmacy  Continue blood pressure medications Refills have been sent  Follow up with Podiatry as soon as possible   This is a list of the screening recommended for you and due dates:  Health Maintenance  Topic Date Due   Eye exam for diabetics  Never done   Pneumococcal Vaccination (2 of 2 - PCV) 05/13/2015   Zoster (Shingles) Vaccine (1 of 2) Never done   Colon Cancer Screening  03/20/2024*   Hemoglobin A1C  12/08/2023   Yearly kidney health urinalysis for diabetes  12/14/2023   Yearly kidney function blood test for diabetes  06/09/2024   Complete foot exam   06/09/2024   DTaP/Tdap/Td vaccine (2 - Td or Tdap) 08/13/2029   Flu Shot  Completed   Hepatitis C Screening  Completed   HIV Screening  Completed   HPV Vaccine  Aged Out   COVID-19 Vaccine  Discontinued  *Topic was postponed. The date shown is not the original due date.     Follow up in 3 months.  Will check blood work at that time   If you have any questions or concerns, please do not hesitate to call the office at [phone].  I look forward to our next visit and until then take care and stay safe.  Regards,   Glenys Ferrari, MD   [facility]

## 2023-08-05 NOTE — Progress Notes (Signed)
 SUBJECTIVE:   Chief Complaint  Patient presents with   Medical Management of Chronic Issues   HPI Presents for follow up chronic disease management  Discussed the use of AI scribe software for clinical note transcription with the patient, who gave verbal consent to proceed.  History of Present Illness The patient, with a history of hypertension and diabetes, has been managing her blood pressure with Lisinopril  40mg , Amlodipine  10mg , and Chlorthalidone  25mg . She reports a recent improvement in blood pressure readings, although the systolic pressure remains slightly above the target range.  Regarding her diabetes, the patient has been monitoring her blood glucose levels at home, noting a decrease from 271 to 176 over the past few days. She has been on Rybelsus  7mg  for at least four weeks. Despite the decrease, the blood glucose levels remain elevated.  The patient also reports a foot condition, which has improved but not completely healed. She denies any leakage or drainage from the foot and denies any fever. She has not yet seen a podiatrist for this issue.  Overall, the patient denies any chest pain, shortness of breath, or leg swelling. She has not reported any new medications or symptoms.   PERTINENT PMH / PSH: As above  OBJECTIVE:  BP 138/84   Pulse 87   Temp 98 F (36.7 C)   Resp 18   Ht 6' (1.829 m)   Wt (!) 319 lb 4 oz (144.8 kg)   SpO2 97%   BMI 43.30 kg/m    Physical Exam Vitals reviewed.  Constitutional:      General: He is not in acute distress.    Appearance: Normal appearance. He is obese. He is not ill-appearing, toxic-appearing or diaphoretic.  Eyes:     General:        Right eye: No discharge.        Left eye: No discharge.  Cardiovascular:     Rate and Rhythm: Normal rate and regular rhythm.     Heart sounds: Normal heart sounds.  Pulmonary:     Effort: Pulmonary effort is normal.     Breath sounds: Normal breath sounds.  Musculoskeletal:         General: Normal range of motion.     Cervical back: Normal range of motion.  Skin:    General: Skin is warm and dry.  Neurological:     Mental Status: He is alert and oriented to person, place, and time. Mental status is at baseline.  Psychiatric:        Mood and Affect: Mood normal.        Behavior: Behavior normal.        Thought Content: Thought content normal.        Judgment: Judgment normal.           07/05/2023    2:52 PM 06/10/2023    2:34 PM 03/21/2023    3:54 PM 12/03/2022    3:56 PM  Depression screen PHQ 2/9  Decreased Interest 0 0 0 0  Down, Depressed, Hopeless 0 0 0 0  PHQ - 2 Score 0 0 0 0  Altered sleeping 0 0 0 0  Tired, decreased energy 0 0 0 0  Change in appetite 0 0 0 2  Feeling bad or failure about yourself  0 0 0 0  Trouble concentrating 0 0 0 0  Moving slowly or fidgety/restless 0 0 0 0  Suicidal thoughts 0 0 0 0  PHQ-9 Score 0 0 0 2  Difficult  doing work/chores Not difficult at all Not difficult at all Not difficult at all Not difficult at all      07/05/2023    2:52 PM 06/10/2023    2:34 PM 03/21/2023    3:55 PM 12/03/2022    3:56 PM  GAD 7 : Generalized Anxiety Score  Nervous, Anxious, on Edge 0 0 0 0  Control/stop worrying 0 0 0 0  Worry too much - different things 0 0 0 0  Trouble relaxing 0 0 0 0  Restless 0 0 0 0  Easily annoyed or irritable 0 0 0 0  Afraid - awful might happen 0 0 0 0  Total GAD 7 Score 0 0 0 0  Anxiety Difficulty Not difficult at all Not difficult at all Not difficult at all Not difficult at all    ASSESSMENT/PLAN:  Diabetes mellitus type 2 with complications (HCC) -     Rybelsus ; Take 1 tablet (14 mg total) by mouth daily.  Dispense: 30 tablet; Refill: 6 -     Empagliflozin ; Take 1 tablet (25 mg total) by mouth daily before breakfast.  Dispense: 30 tablet; Refill: 6  Hypertension associated with diabetes (HCC) -     Chlorthalidone ; Take 1 tablet (25 mg total) by mouth daily.  Dispense: 30 tablet; Refill: 6 -      amLODIPine  Besylate; Take 1 tablet (10 mg total) by mouth daily.  Dispense: 30 tablet; Refill: 6   PDMP reviewed  Return in about 3 months (around 11/03/2023) for PCP, DM, HTN.  Glenys Ferrari, MD

## 2023-08-13 ENCOUNTER — Encounter: Payer: Self-pay | Admitting: Family Medicine

## 2023-08-13 NOTE — Assessment & Plan Note (Signed)
Healing but not completely resolved. No signs of infection. -Schedule appointment with podiatrist. -Follow-up in 3 months.

## 2023-08-13 NOTE — Assessment & Plan Note (Signed)
Home glucose readings variable, ranging from 176-271. Currently on Rybelsus 7mg  daily. -Increase Rybelsus to 14mg  daily (take two 7mg  tablets daily until 14mg  tablets are obtained). -Check A1C in April 2025.

## 2023-08-13 NOTE — Assessment & Plan Note (Signed)
Blood pressure improved but still above goal. Currently on Lisinopril 40mg , Amlodipine 10mg , and Chlorthalidone 25mg  daily. -Continue current medications. -Check blood pressure in 4 weeks.

## 2023-08-25 ENCOUNTER — Emergency Department: Payer: 59

## 2023-08-25 ENCOUNTER — Observation Stay
Admission: EM | Admit: 2023-08-25 | Discharge: 2023-08-26 | Disposition: A | Discharge status: Home / Self Care | Payer: 59 | Attending: Internal Medicine | Admitting: Internal Medicine

## 2023-08-25 ENCOUNTER — Other Ambulatory Visit: Payer: Self-pay

## 2023-08-25 DIAGNOSIS — R7989 Other specified abnormal findings of blood chemistry: Secondary | ICD-10-CM | POA: Diagnosis not present

## 2023-08-25 DIAGNOSIS — E876 Hypokalemia: Secondary | ICD-10-CM | POA: Insufficient documentation

## 2023-08-25 DIAGNOSIS — E119 Type 2 diabetes mellitus without complications: Secondary | ICD-10-CM

## 2023-08-25 DIAGNOSIS — Z79899 Other long term (current) drug therapy: Secondary | ICD-10-CM | POA: Diagnosis not present

## 2023-08-25 DIAGNOSIS — I152 Hypertension secondary to endocrine disorders: Secondary | ICD-10-CM

## 2023-08-25 DIAGNOSIS — R2681 Unsteadiness on feet: Secondary | ICD-10-CM | POA: Diagnosis not present

## 2023-08-25 DIAGNOSIS — I10 Essential (primary) hypertension: Secondary | ICD-10-CM | POA: Diagnosis not present

## 2023-08-25 DIAGNOSIS — R531 Weakness: Secondary | ICD-10-CM

## 2023-08-25 DIAGNOSIS — R109 Unspecified abdominal pain: Secondary | ICD-10-CM | POA: Diagnosis not present

## 2023-08-25 DIAGNOSIS — E1165 Type 2 diabetes mellitus with hyperglycemia: Secondary | ICD-10-CM | POA: Diagnosis not present

## 2023-08-25 DIAGNOSIS — E118 Type 2 diabetes mellitus with unspecified complications: Secondary | ICD-10-CM

## 2023-08-25 DIAGNOSIS — Z7984 Long term (current) use of oral hypoglycemic drugs: Secondary | ICD-10-CM | POA: Insufficient documentation

## 2023-08-25 DIAGNOSIS — Z6841 Body Mass Index (BMI) 40.0 and over, adult: Secondary | ICD-10-CM | POA: Diagnosis not present

## 2023-08-25 DIAGNOSIS — Z87891 Personal history of nicotine dependence: Secondary | ICD-10-CM | POA: Diagnosis not present

## 2023-08-25 DIAGNOSIS — D509 Iron deficiency anemia, unspecified: Secondary | ICD-10-CM | POA: Diagnosis not present

## 2023-08-25 DIAGNOSIS — R000 Tachycardia, unspecified: Secondary | ICD-10-CM

## 2023-08-25 DIAGNOSIS — E1159 Type 2 diabetes mellitus with other circulatory complications: Secondary | ICD-10-CM | POA: Diagnosis present

## 2023-08-25 DIAGNOSIS — R002 Palpitations: Secondary | ICD-10-CM

## 2023-08-25 DIAGNOSIS — Z1152 Encounter for screening for COVID-19: Secondary | ICD-10-CM | POA: Insufficient documentation

## 2023-08-25 DIAGNOSIS — E1129 Type 2 diabetes mellitus with other diabetic kidney complication: Secondary | ICD-10-CM

## 2023-08-25 DIAGNOSIS — E871 Hypo-osmolality and hyponatremia: Secondary | ICD-10-CM | POA: Insufficient documentation

## 2023-08-25 DIAGNOSIS — R718 Other abnormality of red blood cells: Secondary | ICD-10-CM | POA: Insufficient documentation

## 2023-08-25 HISTORY — DX: Weakness: R53.1

## 2023-08-25 LAB — TROPONIN I (HIGH SENSITIVITY)
Troponin I (High Sensitivity): 23 ng/L — ABNORMAL HIGH (ref <18)
Troponin I (High Sensitivity): 22 ng/L — ABNORMAL HIGH (ref <18)

## 2023-08-25 LAB — URINALYSIS, ROUTINE W REFLEX MICROSCOPIC
Bacteria, UA: NONE SEEN
Bilirubin Urine: NEGATIVE
Glucose, UA: >=500 mg/dL — AB
Ketones, ur: 20 mg/dL — AB
Leukocytes,Ua: NEGATIVE
Nitrite: NEGATIVE
Protein, ur: 100 mg/dL — AB
Specific Gravity, Urine: 1.029 (ref 1.005–1.030)
pH: 5 (ref 5.0–8.0)

## 2023-08-25 LAB — COMPREHENSIVE METABOLIC PANEL
ALT: 22 U/L (ref 0–44)
AST: 23 U/L (ref 15–41)
Albumin: 3.5 g/dL (ref 3.5–5.0)
Alkaline Phosphatase: 53 U/L (ref 38–126)
Anion gap: 14 (ref 5–15)
BUN: 18 mg/dL (ref 6–20)
CO2: 27 mmol/L (ref 22–32)
Calcium: 8.8 mg/dL — ABNORMAL LOW (ref 8.9–10.3)
Chloride: 90 mmol/L — ABNORMAL LOW (ref 98–111)
Creatinine, Ser: 0.94 mg/dL (ref 0.61–1.24)
GFR, Estimated: >60 mL/min (ref >60)
Glucose, Bld: 175 mg/dL — ABNORMAL HIGH (ref 70–99)
Potassium: 3.2 mmol/L — ABNORMAL LOW (ref 3.5–5.1)
Sodium: 131 mmol/L — ABNORMAL LOW (ref 135–145)
Total Bilirubin: 1.2 mg/dL (ref 0.0–1.2)
Total Protein: 7.4 g/dL (ref 6.5–8.1)

## 2023-08-25 LAB — RESP PANEL BY RT-PCR (RSV, FLU A&B, COVID)  RVPGX2
Influenza A by PCR: NEGATIVE
Influenza B by PCR: NEGATIVE
Resp Syncytial Virus by PCR: NEGATIVE
SARS Coronavirus 2 by RT PCR: NEGATIVE

## 2023-08-25 LAB — CBC WITH DIFFERENTIAL/PLATELET
Abs Immature Granulocytes: 0.04 10*3/uL (ref 0.00–0.07)
Basophils Absolute: 0.1 10*3/uL (ref 0.0–0.1)
Basophils Relative: 1 %
Eosinophils Absolute: 0 10*3/uL (ref 0.0–0.5)
Eosinophils Relative: 0 %
HCT: 42.2 % (ref 39.0–52.0)
Hemoglobin: 14.7 g/dL (ref 13.0–17.0)
Immature Granulocytes: 1 %
Lymphocytes Relative: 11 %
Lymphs Abs: 1 10*3/uL (ref 0.7–4.0)
MCH: 27.6 pg (ref 26.0–34.0)
MCHC: 34.8 g/dL (ref 30.0–36.0)
MCV: 79.3 fL — ABNORMAL LOW (ref 80.0–100.0)
Monocytes Absolute: 0.7 10*3/uL (ref 0.1–1.0)
Monocytes Relative: 8 %
Neutro Abs: 7 10*3/uL (ref 1.7–7.7)
Neutrophils Relative %: 79 %
Platelets: 214 10*3/uL (ref 150–400)
RBC: 5.32 MIL/uL (ref 4.22–5.81)
RDW: 14.1 % (ref 11.5–15.5)
WBC: 8.8 10*3/uL (ref 4.0–10.5)
nRBC: 0 % (ref 0.0–0.2)

## 2023-08-25 LAB — D-DIMER, QUANTITATIVE: D-Dimer, Quant: 1.63 ug{FEU}/mL — ABNORMAL HIGH (ref 0.00–0.50)

## 2023-08-25 MED ORDER — SODIUM CHLORIDE 0.9 % IV BOLUS
1000.0000 mL | Freq: Once | INTRAVENOUS | Status: AC
  Administered 2023-08-25: 1000 mL via INTRAVENOUS

## 2023-08-25 MED ORDER — IOHEXOL 350 MG/ML SOLN
125.0000 mL | Freq: Once | INTRAVENOUS | Status: AC | PRN
  Administered 2023-08-25: 125 mL via INTRAVENOUS

## 2023-08-25 NOTE — ED Provider Triage Note (Signed)
Emergency Medicine Provider Triage Evaluation Note  Graviel Payeur , a 53 y.o. male  was evaluated in triage.  Pt complains of shaking chills yesterday and fatigue and then had trouble walking yesterday so he was afraid he had a stroke. No unilateral weakness or paresthesias. No CP/SOB.  Review of Systems  Positive: Ataxia, chills, fatigue Negative: Cough, cp/sob  Physical Exam  There were no vitals taken for this visit. Gen:   Awake, no distress   Resp:  Normal effort  MSK:   Moves extremities without difficulty  Other:    Medical Decision Making  Medically screening exam initiated at 2:52 PM.  Appropriate orders placed.  Doree Albee was informed that the remainder of the evaluation will be completed by another provider, this initial triage assessment does not replace that evaluation, and the importance of remaining in the ED until their evaluation is complete.     Jackelyn Hoehn, PA-C 08/25/23 1456

## 2023-08-25 NOTE — ED Notes (Signed)
 Pt to MRI

## 2023-08-25 NOTE — H&P (Signed)
History and Physical    Patient: [name] UJW:[id number] DOB: Feb 09, 1971 DOA: 08/25/2023 DOS: the patient was seen and examined on 08/26/2023 PCP: Dana Allan, MD  Patient coming from: Home  Chief Complaint:  Chief Complaint  Patient presents with   Weakness   HPI: [name] is a 53 y.o. male with medical history significant of hypertension and type 2 diabetes mellitus who presents to the emergency department due to generalized weakness and confusion.  Patient states that he had a trip to [place] and just recently came back, while there, he felt very tired and sleepy and also complained of some increased urinary frequency and dysuria.  He complained of chills as well.  He continued to feel very tired and without energy and now complains of feeling unsteady while ambulating.  He denies chest pain, shortness of breath, headache.  Patient states that he was fine and at baseline prior to the trip.  ED Course:  In the emergency department, patient was tachycardic, BP was 133/96, other vital signs are within normal range.  Workup in the ED showed normal CBC except for MCV of 79.3.  BMP showed sodium of 131, potassium 3.2, chloride 90, bicarb 27, blood glucose 125, BUN 18, creatinine 0.94, calcium 8.8.  Troponin 23 > 22, D-dimer 1.63, urinalysis was positive for glycosuria but unimpressive for UTI.  Influenza A, B, SARS, respiratory, RSV was negative. CT head without contrast showed no evidence for acute intracranial normality. CT angiography chest showed no evidence of significant pulmonary embolus and no evidence of active pulmonary disease. CT abdomen and pelvis with contrast showed diffuse fatty infiltration of the liver.  Moderate periumbilical hernia with fat.  Small left inguinal hernia with fat. CT angiography of head showed no emergent large vessel occlusion MRI head without contrast showed no acute intraocular abnormality MRI cervical spine without contrast showed  no spinal canal or neuroforaminal stenosis of the cervical spine IV hydration of NS was given.  Hospitalist was asked to admit patient for further evaluation and management.  Normal sinus rhythm with PACs with aberrant conduction at the rate of 89 bpm  Review of Systems: Review of systems as noted in the HPI. All other systems reviewed and are negative.   Past Medical History:  Diagnosis Date   Diabetes mellitus without complication (HCC)    Hypertension    History reviewed. No pertinent surgical history.  Social History:  reports that he has quit smoking. His smoking use included cigarettes. He has never used smokeless tobacco. He reports that he does not currently use alcohol. He reports that he does not use drugs.   Allergies  Allergen Reactions   Lodine [Etodolac] Swelling    History reviewed. No pertinent family history.   Prior to Admission medications   Medication Sig Start Date End Date Taking? Authorizing Provider  amLODipine (NORVASC) 10 MG tablet Take 1 tablet (10 mg total) by mouth daily. 08/05/23   Dana Allan, MD  chlorthalidone (HYGROTON) 25 MG tablet Take 1 tablet (25 mg total) by mouth daily. 08/05/23   Dana Allan, MD  empagliflozin (JARDIANCE) 25 MG TABS tablet Take 1 tablet (25 mg total) by mouth daily before breakfast. 08/05/23   Dana Allan, MD  glipiZIDE (GLUCOTROL) 5 MG tablet Take 1 tablet (5 mg total) by mouth 2 (two) times daily. 07/05/23   Dana Allan, MD  lisinopril (ZESTRIL) 20 MG tablet Take 2 tablets (40 mg total) by mouth at bedtime. 06/10/23   Dana Allan, MD  metFORMIN (  GLUCOPHAGE-XR) 500 MG 24 hr tablet Take 1 tablet (500 mg total) by mouth 2 (two) times daily with a meal. 06/10/23   Dana Allan, MD  Semaglutide (RYBELSUS) 14 MG TABS Take 1 tablet (14 mg total) by mouth daily. 08/05/23   Dana Allan, MD    Physical Exam: BP 123/67   Pulse 98   Temp 98.6 F (37 C) (Oral)   Resp (!) 21   Ht 5\' 11"  (1.803 m)   Wt (!) 147.4 kg   SpO2 96%    BMI 45.33 kg/m   General: 52 y.o. year-old male well developed well nourished in no acute distress.  Alert and oriented x3. HEENT: NCAT, EOMI Neck: Supple, trachea medial Cardiovascular: Regular rate and rhythm with no rubs or gallops.  No thyromegaly or JVD noted.  No lower extremity edema. 2/4 pulses in all 4 extremities. Respiratory: Clear to auscultation with no wheezes or rales. Good inspiratory effort. Abdomen: Soft, nontender nondistended with normal bowel sounds x4 quadrants. Muskuloskeletal: No cyanosis, clubbing or edema noted bilaterally Neuro: CN II-XII intact, strength 5/5 x 4, sensation, reflexes intact Skin: No ulcerative lesions noted or rashes Psychiatry: Judgement and insight appear normal. Mood is appropriate for condition and setting          Labs on Admission:  Basic Metabolic Panel: Recent Labs  Lab 08/25/23 1454  NA 131*  K 3.2*  CL 90*  CO2 27  GLUCOSE 175*  BUN 18  CREATININE 0.94  CALCIUM 8.8*   Liver Function Tests: Recent Labs  Lab 08/25/23 1454  AST 23  ALT 22  ALKPHOS 53  BILITOT 1.2  PROT 7.4  ALBUMIN 3.5   No results for input(s): "LIPASE", "AMYLASE" in the last 168 hours. No results for input(s): "AMMONIA" in the last 168 hours. CBC: Recent Labs  Lab 08/25/23 1454  WBC 8.8  NEUTROABS 7.0  HGB 14.7  HCT 42.2  MCV 79.3*  PLT 214   Cardiac Enzymes: No results for input(s): "CKTOTAL", "CKMB", "CKMBINDEX", "TROPONINI" in the last 168 hours.  BNP (last 3 results) No results for input(s): "BNP" in the last 8760 hours.  ProBNP (last 3 results) No results for input(s): "PROBNP" in the last 8760 hours.  CBG: No results for input(s): "GLUCAP" in the last 168 hours.  Radiological Exams on Admission: MR BRAIN WO CONTRAST Result Date: 08/25/2023 CLINICAL DATA:  Acute neurologic deficit. Weakness, dizziness and blurry vision EXAM: MRI HEAD WITHOUT CONTRAST MRI CERVICAL SPINE WITHOUT CONTRAST TECHNIQUE: Multiplanar, multiecho pulse  sequences of the brain and surrounding structures, and cervical spine, to include the craniocervical junction and cervicothoracic junction, were obtained without intravenous contrast. COMPARISON:  None Available. FINDINGS: MRI HEAD FINDINGS Brain: No acute infarct, mass effect or extra-axial collection. No acute or chronic hemorrhage. There is multifocal hyperintense T2-weighted signal within the white matter. Parenchymal volume and CSF spaces are normal. The midline structures are normal. Vascular: Normal flow voids. Skull and upper cervical spine: Normal calvarium and skull base. Visualized upper cervical spine and soft tissues are normal. Sinuses/Orbits:Partial opacification of the left maxillary sinus. Normal orbits. MRI CERVICAL SPINE FINDINGS Alignment: Physiologic. Vertebrae: No fracture, evidence of discitis, or bone lesion. Cord: Normal signal and morphology. Posterior Fossa, vertebral arteries, paraspinal tissues: Negative. Disc levels: C1-2: Unremarkable. C2-3: Normal disc space and facet joints. There is no spinal canal stenosis. No neural foraminal stenosis. C3-4: Normal disc space and facet joints. There is no spinal canal stenosis. No neural foraminal stenosis. C4-5: Normal disc space and facet  joints. There is no spinal canal stenosis. No neural foraminal stenosis. C5-6: Small disc bulge. There is no spinal canal stenosis. No neural foraminal stenosis. C6-7: Normal disc space and facet joints. There is no spinal canal stenosis. No neural foraminal stenosis. C7-T1: Normal disc space and facet joints. There is no spinal canal stenosis. No neural foraminal stenosis. IMPRESSION: 1. No acute intracranial abnormality. 2. Multifocal hyperintense T2-weighted signal within the white matter, nonspecific but most commonly seen in the setting of chronic small vessel ischemia. 3. No spinal canal or neural foraminal stenosis of the cervical spine. Electronically Signed   By: Deatra Robinson M.D.   On: 08/25/2023  21:13   MR Cervical Spine Wo Contrast Result Date: 08/25/2023 CLINICAL DATA:  Acute neurologic deficit. Weakness, dizziness and blurry vision EXAM: MRI HEAD WITHOUT CONTRAST MRI CERVICAL SPINE WITHOUT CONTRAST TECHNIQUE: Multiplanar, multiecho pulse sequences of the brain and surrounding structures, and cervical spine, to include the craniocervical junction and cervicothoracic junction, were obtained without intravenous contrast. COMPARISON:  None Available. FINDINGS: MRI HEAD FINDINGS Brain: No acute infarct, mass effect or extra-axial collection. No acute or chronic hemorrhage. There is multifocal hyperintense T2-weighted signal within the white matter. Parenchymal volume and CSF spaces are normal. The midline structures are normal. Vascular: Normal flow voids. Skull and upper cervical spine: Normal calvarium and skull base. Visualized upper cervical spine and soft tissues are normal. Sinuses/Orbits:Partial opacification of the left maxillary sinus. Normal orbits. MRI CERVICAL SPINE FINDINGS Alignment: Physiologic. Vertebrae: No fracture, evidence of discitis, or bone lesion. Cord: Normal signal and morphology. Posterior Fossa, vertebral arteries, paraspinal tissues: Negative. Disc levels: C1-2: Unremarkable. C2-3: Normal disc space and facet joints. There is no spinal canal stenosis. No neural foraminal stenosis. C3-4: Normal disc space and facet joints. There is no spinal canal stenosis. No neural foraminal stenosis. C4-5: Normal disc space and facet joints. There is no spinal canal stenosis. No neural foraminal stenosis. C5-6: Small disc bulge. There is no spinal canal stenosis. No neural foraminal stenosis. C6-7: Normal disc space and facet joints. There is no spinal canal stenosis. No neural foraminal stenosis. C7-T1: Normal disc space and facet joints. There is no spinal canal stenosis. No neural foraminal stenosis. IMPRESSION: 1. No acute intracranial abnormality. 2. Multifocal hyperintense T2-weighted  signal within the white matter, nonspecific but most commonly seen in the setting of chronic small vessel ischemia. 3. No spinal canal or neural foraminal stenosis of the cervical spine. Electronically Signed   By: Deatra Robinson M.D.   On: 08/25/2023 21:13   CT ANGIO HEAD NECK W WO CM Result Date: 08/25/2023 CLINICAL DATA:  Stroke follow-up EXAM: CT ANGIOGRAPHY HEAD AND NECK WITH AND WITHOUT CONTRAST TECHNIQUE: Multidetector CT imaging of the head and neck was performed using the standard protocol during bolus administration of intravenous contrast. Multiplanar CT image reconstructions and MIPs were obtained to evaluate the vascular anatomy. Carotid stenosis measurements (when applicable) are obtained utilizing NASCET criteria, using the distal internal carotid diameter as the denominator. RADIATION DOSE REDUCTION: This exam was performed according to the departmental dose-optimization program which includes automated exposure control, adjustment of the mA and/or kV according to patient size and/or use of iterative reconstruction technique. CONTRAST:  OMNIPAQUE IOHEXOL 350 MG/ML SOLN COMPARISON:  None Available. FINDINGS: CTA NECK FINDINGS Skeleton: No acute abnormality or high grade bony spinal canal stenosis. Other neck: Normal pharynx, larynx and major salivary glands. No cervical lymphadenopathy. Unremarkable thyroid gland. Upper chest: No pneumothorax or pleural effusion. No nodules or masses.  Aortic arch: There is calcific atherosclerosis of the aortic arch. Normal variant aortic arch branching pattern with the brachiocephalic and left common carotid arteries sharing a common origin. RIGHT carotid system: No dissection, occlusion or aneurysm. There is calcified atherosclerosis extending into the proximal ICA, resulting in less than 50% stenosis. LEFT carotid system: No dissection, occlusion or aneurysm. There is mixed density atherosclerosis extending into the proximal ICA, resulting in less than 50%  stenosis. Vertebral arteries: Codominant configuration. There is no dissection, occlusion or flow-limiting stenosis to the skull base (V1-V3 segments). CTA HEAD FINDINGS POSTERIOR CIRCULATION: Mild bilateral V4 segment vertebral artery calcification without stenosis of significance. No proximal occlusion of the anterior or inferior cerebellar arteries. 2 mm infundibulum at the origin of the right superior cerebellar artery. Basilar artery is normal. Superior cerebellar arteries are normal. Posterior cerebral arteries are normal. ANTERIOR CIRCULATION: Atherosclerotic calcification of the internal carotid arteries at the skull base without hemodynamically significant stenosis. Anterior cerebral arteries are normal. Middle cerebral arteries are normal. Venous sinuses: As permitted by contrast timing, patent. Anatomic variants: None Review of the MIP images confirms the above findings. IMPRESSION: 1. No emergent large vessel occlusion. 2. Bilateral carotid bifurcation atherosclerosis without hemodynamically significant stenosis. Aortic atherosclerosis (ICD10-I70.0). Electronically Signed   By: Deatra Robinson M.D.   On: 08/25/2023 19:18   CT Angio Chest PE W and/or Wo Contrast Result Date: 08/25/2023 CLINICAL DATA:  Pulmonary embolus suspected with low to intermediate probability. Positive D-dimer. Weakness, dizziness, blurry vision, and off balance feeling starting yesterday. Abdominal and flank pain with stone suspected. EXAM: CT ANGIOGRAPHY CHEST CT ABDOMEN AND PELVIS WITH CONTRAST TECHNIQUE: Multidetector CT imaging of the chest was performed using the standard protocol during bolus administration of intravenous contrast. Multiplanar CT image reconstructions and MIPs were obtained to evaluate the vascular anatomy. Multidetector CT imaging of the abdomen and pelvis was performed using the standard protocol during bolus administration of intravenous contrast. RADIATION DOSE REDUCTION: This exam was performed according  to the departmental dose-optimization program which includes automated exposure control, adjustment of the mA and/or kV according to patient size and/or use of iterative reconstruction technique. CONTRAST:  OMNIPAQUE IOHEXOL 350 MG/ML SOLN COMPARISON:  Chest radiograph 08/25/2023. CT abdomen and pelvis 04/12/2006. FINDINGS: CTA CHEST FINDINGS Cardiovascular: Technically adequate study with moderately good opacification of the central and segmental pulmonary arteries. No focal filling defects are demonstrated. No evidence of significant pulmonary embolus. Normal heart size. No pericardial effusions. Normal caliber thoracic aorta. Calcification of the aorta and coronary arteries. Mediastinum/Nodes: Thyroid gland is unremarkable. Esophagus is decompressed. No significant lymphadenopathy. Lungs/Pleura: Motion artifact. Lungs are clear. No pleural effusion or pneumothorax. Musculoskeletal: Degenerative changes in the spine. No acute bony abnormalities. Review of the MIP images confirms the above findings. CT ABDOMEN and PELVIS FINDINGS Hepatobiliary: Diffuse fatty infiltration of the liver. Cholelithiasis with a single stone demonstrated. No gallbladder wall thickening or edema. No bile duct dilatation. Pancreas: Unremarkable. No pancreatic ductal dilatation or surrounding inflammatory changes. Spleen: Normal in size without focal abnormality. Adrenals/Urinary Tract: No adrenal gland nodules. Pelvic location of the left kidney. Nephrograms appear symmetrical. No solid mass lesion identified. No hydronephrosis or hydroureter. Bladder is normal. Stomach/Bowel: Stomach, small bowel, and colon are not abnormally distended. Stool throughout the colon. Appendix is normal. Vascular/Lymphatic: Aortic atherosclerosis. No enlarged abdominal or pelvic lymph nodes. Reproductive: Prostate is unremarkable. Other: No free air or free fluid in the abdomen. Moderate-sized periumbilical hernia containing fat. Small left inguinal  hernia containing fat. Musculoskeletal: Degenerative  changes in the spine. No acute bony abnormalities. Review of the MIP images confirms the above findings. IMPRESSION: 1. No evidence of significant pulmonary embolus. 2. No evidence of active pulmonary disease. 3. Diffuse fatty infiltration of the liver. 4. Pelvic left kidney.  Otherwise normal appearance. 5. Aortic atherosclerosis. 6. Moderate periumbilical hernia with fat. Small left inguinal hernia with fat. Electronically Signed   By: Burman Nieves M.D.   On: 08/25/2023 19:07   CT ABDOMEN PELVIS W CONTRAST Result Date: 08/25/2023 CLINICAL DATA:  Pulmonary embolus suspected with low to intermediate probability. Positive D-dimer. Weakness, dizziness, blurry vision, and off balance feeling starting yesterday. Abdominal and flank pain with stone suspected. EXAM: CT ANGIOGRAPHY CHEST CT ABDOMEN AND PELVIS WITH CONTRAST TECHNIQUE: Multidetector CT imaging of the chest was performed using the standard protocol during bolus administration of intravenous contrast. Multiplanar CT image reconstructions and MIPs were obtained to evaluate the vascular anatomy. Multidetector CT imaging of the abdomen and pelvis was performed using the standard protocol during bolus administration of intravenous contrast. RADIATION DOSE REDUCTION: This exam was performed according to the departmental dose-optimization program which includes automated exposure control, adjustment of the mA and/or kV according to patient size and/or use of iterative reconstruction technique. CONTRAST:  OMNIPAQUE IOHEXOL 350 MG/ML SOLN COMPARISON:  Chest radiograph 08/25/2023. CT abdomen and pelvis 04/12/2006. FINDINGS: CTA CHEST FINDINGS Cardiovascular: Technically adequate study with moderately good opacification of the central and segmental pulmonary arteries. No focal filling defects are demonstrated. No evidence of significant pulmonary embolus. Normal heart size. No pericardial effusions. Normal  caliber thoracic aorta. Calcification of the aorta and coronary arteries. Mediastinum/Nodes: Thyroid gland is unremarkable. Esophagus is decompressed. No significant lymphadenopathy. Lungs/Pleura: Motion artifact. Lungs are clear. No pleural effusion or pneumothorax. Musculoskeletal: Degenerative changes in the spine. No acute bony abnormalities. Review of the MIP images confirms the above findings. CT ABDOMEN and PELVIS FINDINGS Hepatobiliary: Diffuse fatty infiltration of the liver. Cholelithiasis with a single stone demonstrated. No gallbladder wall thickening or edema. No bile duct dilatation. Pancreas: Unremarkable. No pancreatic ductal dilatation or surrounding inflammatory changes. Spleen: Normal in size without focal abnormality. Adrenals/Urinary Tract: No adrenal gland nodules. Pelvic location of the left kidney. Nephrograms appear symmetrical. No solid mass lesion identified. No hydronephrosis or hydroureter. Bladder is normal. Stomach/Bowel: Stomach, small bowel, and colon are not abnormally distended. Stool throughout the colon. Appendix is normal. Vascular/Lymphatic: Aortic atherosclerosis. No enlarged abdominal or pelvic lymph nodes. Reproductive: Prostate is unremarkable. Other: No free air or free fluid in the abdomen. Moderate-sized periumbilical hernia containing fat. Small left inguinal hernia containing fat. Musculoskeletal: Degenerative changes in the spine. No acute bony abnormalities. Review of the MIP images confirms the above findings. IMPRESSION: 1. No evidence of significant pulmonary embolus. 2. No evidence of active pulmonary disease. 3. Diffuse fatty infiltration of the liver. 4. Pelvic left kidney.  Otherwise normal appearance. 5. Aortic atherosclerosis. 6. Moderate periumbilical hernia with fat. Small left inguinal hernia with fat. Electronically Signed   By: Burman Nieves M.D.   On: 08/25/2023 19:07   CT Head Wo Contrast Result Date: 08/25/2023 CLINICAL DATA:  Headache EXAM: CT  HEAD WITHOUT CONTRAST TECHNIQUE: Contiguous axial images were obtained from the base of the skull through the vertex without intravenous contrast. RADIATION DOSE REDUCTION: This exam was performed according to the departmental dose-optimization program which includes automated exposure control, adjustment of the mA and/or kV according to patient size and/or use of iterative reconstruction technique. COMPARISON:  CT brain 10/12/2010 FINDINGS: Brain:  No acute territorial infarction, hemorrhage or intracranial mass. Mild white matter hypodensity. Nonenlarged ventricles Vascular: No hyperdense vessels.  No unexpected calcification Skull: Normal. Negative for fracture or focal lesion. Sinuses/Orbits: Moderate mucosal thickening and small fluid level left maxillary sinus. Mild mucosal thickening in the ethmoid sinuses Other: None IMPRESSION: 1. No CT evidence for acute intracranial abnormality. Mild white matter hypodensity, nonspecific but most commonly due to chronic small vessel ischemic change. 2. Left maxillary sinus disease. Electronically Signed   By: Jasmine Pang M.D.   On: 08/25/2023 15:28   DG Chest 2 View Result Date: 08/25/2023 CLINICAL DATA:  Fatigue.  Weakness, dizziness and blurry vision. EXAM: CHEST - 2 VIEW COMPARISON:  07/20/2012 FINDINGS: Stable cardiac enlargement. No pleural fluid, interstitial edema, or airspace consolidation. The visualized osseous structures are unremarkable. IMPRESSION: 1. No acute cardiopulmonary disease. 2. Cardiomegaly. Electronically Signed   By: Signa Kell M.D.   On: 08/25/2023 15:21    EKG: I independently viewed the EKG done and my findings are as followed: Normal sinus rhythm with PACs with aberrant conduction at a rate of 89 bpm  Assessment/Plan Present on Admission:  Hypertension associated with diabetes (HCC)  Obesity, Class III, BMI 40-49.9 (morbid obesity) (HCC)  Principal Problem:   Generalized weakness Active Problems:   Obesity, Class III, BMI  40-49.9 (morbid obesity) (HCC)   Hypertension associated with diabetes (HCC)   Hyponatremia   Hypokalemia   Type 2 diabetes mellitus with hyperglycemia (HCC)   Elevated troponin   Elevated d-dimer   Low mean corpuscular volume (MCV)  Generalized weakness possibly due to multifactorial Continue supportive measures Vitamin B12/folate levels will be checked Continue fall precaution Continue PT/OT eval and treat  Hyponatremia Na 131, IV hydration was provided in the ED Further IV hydration will be restricted at this time and workup for hyponatremia will be done Orthostatic BP will be checked to rule out postural hypotension Continue to monitor sodium with serial BMPs Urine osmolality, serum osmolality and urine sodium will be checked  Hypokalemia K+ 3.2, this will be replenished  T2DM with hyperglycemia Hemoglobin A1c on 06/10/2023 was 12.8 Continue Semglee 10 units nightly and adjust dose accordingly Continue ISS and hypoglycemia protocol  Elevated troponin possibly due to type II demand ischemia Troponin has flattened; 23 > 22.  Patient denies any chest pain  Elevated D-dimer D-dimer 1.63; negative for chest ruled out pulmonary embolus  Low MCV MCV 79.3; iron studies will be done  Hypertension associated with diabetes Continue amlodipine, lisinopril  Morbid obesity (BMI 45.33) Patient was counseled about the cardiovascular and metabolic risk of morbid obesity. Patient was counseled for diet control, exercise regimen and weight loss.    DVT prophylaxis: Lovenox  Code Status: Full code  Family Communication: None at bedside  Consults: None  Severity of Illness: The appropriate patient status for this patient is INPATIENT. Inpatient status is judged to be reasonable and necessary in order to provide the required intensity of service to ensure the patient's safety. The patient's presenting symptoms, physical exam findings, and initial radiographic and laboratory data  in the context of their chronic comorbidities is felt to place them at high risk for further clinical deterioration. Furthermore, it is not anticipated that the patient will be medically stable for discharge from the hospital within 2 midnights of admission.   * I certify that at the point of admission it is my clinical judgment that the patient will require inpatient hospital care spanning beyond 2 midnights from the point of  admission due to high intensity of service, high risk for further deterioration and high frequency of surveillance required.*  Author: Frankey Shown, DO 08/26/2023 1:34 AM  For on call review [email or web address].

## 2023-08-25 NOTE — ED Notes (Signed)
Pt sleeping in stretcher at this time. Denies needs at this time. Pending provider reeval.

## 2023-08-25 NOTE — ED Provider Notes (Signed)
[facility] Provider Note    Event Date/Time   First MD Initiated Contact with Patient 08/25/23 1652     (approximate)   History   Weakness   HPI  [name] is a 53 y.o. male with a history of diabetes and hypertension presents to the ER for evaluation of confusion weakness.  Just recently traveled back from [place].  States that while there did develop some increased urinary frequency dysuria.  Has had some chills but no measured temperature.  No cough or congestion no sick contacts.  States that he has been feeling very unsteady with walking.     Physical Exam   Triage Vital Signs: ED Triage Vitals  Encounter Vitals Group     BP 08/25/23 1456 (!) 133/96     Systolic BP Percentile --      Diastolic BP Percentile --      Pulse Rate 08/25/23 1456 (!) 111     Resp 08/25/23 1456 18     Temp 08/25/23 1456 98.5 F (36.9 C)     Temp src --      SpO2 08/25/23 1456 94 %     Weight 08/25/23 1455 (!) 325 lb (147.4 kg)     Height 08/25/23 1455 5\' 11"  (1.803 m)     Head Circumference --      Peak Flow --      Pain Score 08/25/23 1455 7     Pain Loc --      Pain Education --      Exclude from Growth Chart --     Most recent vital signs: Vitals:   08/25/23 1857 08/25/23 1945  BP:  131/77  Pulse:  (!) 103  Resp:  19  Temp: 98.6 F (37 C)   SpO2:  93%     Constitutional: Alert  Eyes: Conjunctivae are normal.  Head: Atraumatic. Nose: No congestion/rhinnorhea. Mouth/Throat: Mucous membranes are moist.   Neck: Painless ROM.  Cardiovascular:   Good peripheral circulation. Respiratory: Normal respiratory effort.  No retractions.  Gastrointestinal: Soft and nontender.  Musculoskeletal:  no deformity Neurologic:  MAE spontaneously. No gross focal neurologic deficits are appreciated.  Skin:  Skin is warm, dry and intact. No rash noted. Psychiatric: Mood and affect are normal. Speech and behavior are normal.    ED Results /  Procedures / Treatments   Labs (all labs ordered are listed, but only abnormal results are displayed) Labs Reviewed  COMPREHENSIVE METABOLIC PANEL - Abnormal; Notable for the following components:      Result Value   Sodium 131 (*)    Potassium 3.2 (*)    Chloride 90 (*)    Glucose, Bld 175 (*)    Calcium 8.8 (*)    All other components within normal limits  CBC WITH DIFFERENTIAL/PLATELET - Abnormal; Notable for the following components:   MCV 79.3 (*)    All other components within normal limits  URINALYSIS, ROUTINE W REFLEX MICROSCOPIC - Abnormal; Notable for the following components:   Color, Urine YELLOW (*)    APPearance HAZY (*)    Glucose, UA >=500 (*)    Hgb urine dipstick SMALL (*)    Ketones, ur 20 (*)    Protein, ur 100 (*)    All other components within normal limits  D-DIMER, QUANTITATIVE - Abnormal; Notable for the following components:   D-Dimer, Quant 1.63 (*)    All other components within normal limits  TROPONIN I (HIGH SENSITIVITY) - Abnormal; Notable  for the following components:   Troponin I (High Sensitivity) 23 (*)    All other components within normal limits  TROPONIN I (HIGH SENSITIVITY) - Abnormal; Notable for the following components:   Troponin I (High Sensitivity) 22 (*)    All other components within normal limits  RESP PANEL BY RT-PCR (RSV, FLU A&B, COVID)  [id number]     EKG  ED ECG REPORT I, Willy Eddy, the attending physician, personally viewed and interpreted this ECG.   Date: 08/25/2023  EKG Time: 15:04  Rate: 110  Rhythm: sinus  Axis: normal  Intervals: normal  ST&T Change: no stemi, non specific st abn    RADIOLOGY Please see ED Course for my review and interpretation.  I personally reviewed all radiographic images ordered to evaluate for the above acute complaints and reviewed radiology reports and findings.  These findings were personally discussed with the patient.  Please see medical record for radiology  report.    PROCEDURES:  Critical Care performed: No  Procedures   MEDICATIONS ORDERED IN ED: Medications  sodium chloride 0.9 % bolus 1,000 mL (0 mLs Intravenous Stopped 08/25/23 1934)  iohexol (OMNIPAQUE) 350 MG/ML injection 125 mL (125 mLs Intravenous Contrast Given 08/25/23 1825)     IMPRESSION / MDM / ASSESSMENT AND PLAN / ED COURSE  I reviewed the triage vital signs and the nursing notes.                              Differential diagnosis includes, but is not limited to, Dehydration, sepsis, pna, uti, hypoglycemia, cva, drug effect, withdrawal, pe, acs,   Patient presenting to the ER for evaluation of symptoms as described above.  Based on symptoms, risk factors and considered above differential, this presenting complaint could reflect a potentially life-threatening illness therefore the patient will be placed on continuous pulse oximetry and telemetry for monitoring.  Laboratory evaluation will be sent to evaluate for the above complaints.  Somewhat of a vague constellation of symptoms with broad differential.  CT head out of triage on my review interpretation without evidence of SDH or IPH.  Chest x-ray without evidence of consolidation.  Troponin mildly elevated but flat.  Does have cardiomegaly he denies any chest pain at this moment.  D-dimer is elevated.  Will order CTA to further evaluate.    Clinical Course as of 08/25/23 2123  Wynelle Link Aug 25, 2023  1610 CTA on my review interpretation without evidence of large PE will await formal radiology report. [PR]  1921 CTA without evidence of PE or acute abnormality per radiology. [PR]  2008 Patient still unsteady on his feet.  Given constellation of findings I am concerned for possible CVA but also with no elevation of Trops and cardiomegaly on chest x-ray possible CHF.  Will order MRI but given his presentation I do believe that he benefit from hospitalization for further workup as he is not back to baseline.  Will consult  hospitalist. [PR]  2059 Discussed case in consultation with Dr.Adefoso.  He is requesting deferring admission until results of MRI available.. [PR]  2122 MRI without evidence of acute stroke.  Patient still not back to his baseline.  Mildly tachycardic.  Still fairly unsteady with ambulation.  Troponins elevated but flat.  Patient will be admitted to hospitalist service for further observation management. [PR]    Clinical Course User Index [PR] Willy Eddy, MD     FINAL CLINICAL IMPRESSION(S) / ED DIAGNOSES  Final diagnoses:  Generalized weakness  Unsteady gait  Tachycardia  Palpitations     Rx / DC Orders   ED Discharge Orders     None        Note:  This document was prepared using Dragon voice recognition software and may include unintentional dictation errors.    Willy Eddy, MD 08/25/23 2123

## 2023-08-25 NOTE — ED Notes (Signed)
 Pt given ice chips per request

## 2023-08-25 NOTE — ED Triage Notes (Signed)
Pt comes with c/o weakness, dizziness, blurry vision and felt off balance. Pt states this all started yesterday morning. Pt did have recent trip to [place] in [place].   Pt states he still feels off balance. Pt denies any numbness or tingling. Pt speaking in clear complete sentences.

## 2023-08-26 DIAGNOSIS — R7989 Other specified abnormal findings of blood chemistry: Secondary | ICD-10-CM

## 2023-08-26 DIAGNOSIS — E876 Hypokalemia: Secondary | ICD-10-CM | POA: Insufficient documentation

## 2023-08-26 DIAGNOSIS — E1165 Type 2 diabetes mellitus with hyperglycemia: Secondary | ICD-10-CM | POA: Insufficient documentation

## 2023-08-26 DIAGNOSIS — R531 Weakness: Secondary | ICD-10-CM | POA: Diagnosis not present

## 2023-08-26 DIAGNOSIS — E871 Hypo-osmolality and hyponatremia: Secondary | ICD-10-CM | POA: Insufficient documentation

## 2023-08-26 DIAGNOSIS — R718 Other abnormality of red blood cells: Secondary | ICD-10-CM | POA: Insufficient documentation

## 2023-08-26 HISTORY — DX: Other specified abnormal findings of blood chemistry: R79.89

## 2023-08-26 LAB — BASIC METABOLIC PANEL
Anion gap: 12 (ref 5–15)
BUN: 17 mg/dL (ref 6–20)
CO2: 25 mmol/L (ref 22–32)
Calcium: 8.3 mg/dL — ABNORMAL LOW (ref 8.9–10.3)
Chloride: 93 mmol/L — ABNORMAL LOW (ref 98–111)
Creatinine, Ser: 0.69 mg/dL (ref 0.61–1.24)
GFR, Estimated: >60 mL/min (ref >60)
Glucose, Bld: 174 mg/dL — ABNORMAL HIGH (ref 70–99)
Potassium: 3.2 mmol/L — ABNORMAL LOW (ref 3.5–5.1)
Sodium: 130 mmol/L — ABNORMAL LOW (ref 135–145)

## 2023-08-26 LAB — COMPREHENSIVE METABOLIC PANEL
ALT: 22 U/L (ref 0–44)
AST: 22 U/L (ref 15–41)
Albumin: 3.2 g/dL — ABNORMAL LOW (ref 3.5–5.0)
Alkaline Phosphatase: 44 U/L (ref 38–126)
Anion gap: 14 (ref 5–15)
BUN: 17 mg/dL (ref 6–20)
CO2: 25 mmol/L (ref 22–32)
Calcium: 8.4 mg/dL — ABNORMAL LOW (ref 8.9–10.3)
Chloride: 94 mmol/L — ABNORMAL LOW (ref 98–111)
Creatinine, Ser: 0.76 mg/dL (ref 0.61–1.24)
GFR, Estimated: >60 mL/min (ref >60)
Glucose, Bld: 143 mg/dL — ABNORMAL HIGH (ref 70–99)
Potassium: 2.6 mmol/L — CL (ref 3.5–5.1)
Sodium: 133 mmol/L — ABNORMAL LOW (ref 135–145)
Total Bilirubin: 1.5 mg/dL — ABNORMAL HIGH (ref 0.0–1.2)
Total Protein: 6.8 g/dL (ref 6.5–8.1)

## 2023-08-26 LAB — OSMOLALITY: Osmolality: 283 mosm/kg (ref 275–295)

## 2023-08-26 LAB — CBC
HCT: 40.2 % (ref 39.0–52.0)
Hemoglobin: 13.6 g/dL (ref 13.0–17.0)
MCH: 27.1 pg (ref 26.0–34.0)
MCHC: 33.8 g/dL (ref 30.0–36.0)
MCV: 80.2 fL (ref 80.0–100.0)
Platelets: 188 10*3/uL (ref 150–400)
RBC: 5.01 MIL/uL (ref 4.22–5.81)
RDW: 14 % (ref 11.5–15.5)
WBC: 6.1 10*3/uL (ref 4.0–10.5)
nRBC: 0 % (ref 0.0–0.2)

## 2023-08-26 LAB — CBG MONITORING, ED
Glucose-Capillary: 163 mg/dL — ABNORMAL HIGH (ref 70–99)
Glucose-Capillary: 162 mg/dL — ABNORMAL HIGH (ref 70–99)
Glucose-Capillary: 236 mg/dL — ABNORMAL HIGH (ref 70–99)

## 2023-08-26 LAB — FERRITIN: Ferritin: 394 ng/mL — ABNORMAL HIGH (ref 24–336)

## 2023-08-26 LAB — MAGNESIUM: Magnesium: 2.2 mg/dL (ref 1.7–2.4)

## 2023-08-26 LAB — IRON AND TIBC
Iron: 33 ug/dL — ABNORMAL LOW (ref 45–182)
Saturation Ratios: 12 % — ABNORMAL LOW (ref 17.9–39.5)
TIBC: 272 ug/dL (ref 250–450)
UIBC: 239 ug/dL

## 2023-08-26 LAB — VITAMIN B12: Vitamin B-12: 851 pg/mL (ref 180–914)

## 2023-08-26 LAB — SODIUM, URINE, RANDOM: Sodium, Ur: 42 mmol/L

## 2023-08-26 LAB — FOLATE: Folate: 35 ng/mL (ref >5.9)

## 2023-08-26 LAB — PHOSPHORUS: Phosphorus: 3 mg/dL (ref 2.5–4.6)

## 2023-08-26 LAB — OSMOLALITY, URINE: Osmolality, Ur: 690 mosm/kg (ref 300–900)

## 2023-08-26 MED ORDER — INSULIN GLARGINE-YFGN 100 UNIT/ML ~~LOC~~ SOLN
10.0000 [IU] | Freq: Every day | SUBCUTANEOUS | Status: DC
  Filled 2023-08-26: qty 0.1

## 2023-08-26 MED ORDER — ACETAMINOPHEN 325 MG PO TABS: 650.0000 mg | ORAL_TABLET | Freq: Four times a day (QID) | ORAL | Status: DC | PRN

## 2023-08-26 MED ORDER — AMLODIPINE BESYLATE 5 MG PO TABS
10.0000 mg | ORAL_TABLET | Freq: Every day | ORAL | Status: DC
  Administered 2023-08-26: 10 mg via ORAL
  Filled 2023-08-26: qty 2

## 2023-08-26 MED ORDER — ENOXAPARIN SODIUM 80 MG/0.8ML IJ SOSY
0.5000 mg/kg | PREFILLED_SYRINGE | INTRAMUSCULAR | Status: DC
  Filled 2023-08-26: qty 0.72

## 2023-08-26 MED ORDER — POTASSIUM CHLORIDE CRYS ER 20 MEQ PO TBCR
40.0000 meq | EXTENDED_RELEASE_TABLET | Freq: Once | ORAL | Status: AC
  Administered 2023-08-26: 40 meq via ORAL
  Filled 2023-08-26: qty 2

## 2023-08-26 MED ORDER — LISINOPRIL 10 MG PO TABS
40.0000 mg | ORAL_TABLET | Freq: Every day | ORAL | Status: DC
  Administered 2023-08-26: 40 mg via ORAL
  Filled 2023-08-26: qty 4

## 2023-08-26 MED ORDER — ONDANSETRON HCL 4 MG PO TABS: 4.0000 mg | ORAL_TABLET | Freq: Four times a day (QID) | ORAL | Status: DC | PRN

## 2023-08-26 MED ORDER — ONDANSETRON HCL 4 MG/2ML IJ SOLN: 4.0000 mg | Freq: Four times a day (QID) | INTRAMUSCULAR | Status: DC | PRN

## 2023-08-26 MED ORDER — INSULIN ASPART 100 UNIT/ML IJ SOLN
0.0000 [IU] | Freq: Three times a day (TID) | INTRAMUSCULAR | Status: DC
  Administered 2023-08-26: 5 [IU] via SUBCUTANEOUS
  Administered 2023-08-26: 3 [IU] via SUBCUTANEOUS
  Filled 2023-08-26 (×2): qty 1

## 2023-08-26 MED ORDER — POTASSIUM CHLORIDE CRYS ER 20 MEQ PO TBCR: 40.0000 meq | EXTENDED_RELEASE_TABLET | Freq: Every day | ORAL | 0 refills | Status: DC

## 2023-08-26 MED ORDER — POTASSIUM CHLORIDE 10 MEQ/100ML IV SOLN
10.0000 meq | INTRAVENOUS | Status: AC
  Administered 2023-08-26 (×4): 10 meq via INTRAVENOUS
  Filled 2023-08-26 (×4): qty 100

## 2023-08-26 MED ORDER — ACETAMINOPHEN 325 MG RE SUPP: 650.0000 mg | Freq: Four times a day (QID) | RECTAL | Status: DC | PRN

## 2023-08-26 NOTE — Progress Notes (Addendum)
PT Cancellation Note  Patient Details Name: [name] MRN: [id number] DOB: [birth year]   Cancelled Treatment:    Reason Eval/Treat Not Completed: PT screened, no needs identified, will sign off: Orders Received. Chart Reviewed. Patient able to ambulate IND without AD. Patient presents at functional baseline. No acute skilled PT needs or PT services warranted upon discharge. PT will sign off.    Howie Ill, PT, DPT 08/26/23 1:38 PM

## 2023-08-26 NOTE — ED Notes (Signed)
Pt ambulates to BR to provide urine sample. Bed remade and pillows given for pt comfort.

## 2023-08-26 NOTE — ED Notes (Addendum)
Pt found to be diaphoretic, temp WNL, BG 163, denies CP/SOB, VSS.

## 2023-08-26 NOTE — ED Notes (Signed)
Pt ambulates to toilet w/ steady gait.

## 2023-08-26 NOTE — ED Notes (Signed)
Ambulatory around ED with no complaints of dizziness. Gait steady. No complaints.

## 2023-08-26 NOTE — Discharge Summary (Signed)
Physician Discharge Summary   Patient: [name] MRN: [id number] DOB: June 04, 1971  Admit date:     08/25/2023  Discharge date: 08/26/23  Discharge Physician: Jonah Blue   PCP: Dana Allan, MD   Recommendations at discharge:   Stop taking chlorthalidone; continue amlodipine and lisinopril Follow up with Dr. Clent Ridges later this week for recheck of labs and to consider alternative BP medications Take Kdur again for the next 2 days  Discharge Diagnoses: Principal Problem:   Generalized weakness Active Problems:   Obesity, Class III, BMI 40-49.9 (morbid obesity) (HCC)   Hypertension associated with diabetes (HCC)   Hyponatremia   Hypokalemia   Type 2 diabetes mellitus with hyperglycemia (HCC)   Elevated troponin   Elevated d-dimer   Low mean corpuscular volume (MCV)   Hospital Course: 52yo with h/o HTN and DM who presented on 2/2 with generalized weakness. Labs and imaging were unremarkable. PT/OT evaluations ordered but he does not have needs so both signed off.  He was found to have profound hypokalemia and is feeling much better post-repletion.  Will dc to home.  Assessment and Plan:  Generalized weakness due to profound hypokalemia Patient had generally reassuring evaluation including labs and studies other than profound hypokalemia (K+ 2.6) This has been repleted and he is feeling much better This appears to be likely to GI and sweating losses as well as decreased PO intake (was hypersomnolent the day(s) PTA) and recent transition from hydrochlorothiazide to chlorthalidone Markedly improved He does not appear to need further hospitalization at this time   Hyponatremia Na 131 Appears to be at/near baseline Outpatient f/u recommended  T2DM with hyperglycemia Hemoglobin A1c on 06/10/2023 was 12.8, poor control Continue Jardiance, metformin, glipizide, semaglutide He appears to need addition of insulin Consider endocrinology referral   Elevated troponin   Minimally elevated with negative delta Suspect demand ischemia in the setting of profound hypokalemia   Elevated D-dimer D-dimer 1.63 Negative CTA, ruled out pulmonary embolus   Hypertension associated with diabetes Continue amlodipine, lisinopril Stop thiazide diuretics F/u with PCP later this week to consider alternative medications   Morbid obesity  Body mass index is 45.33 kg/m.Marland Kitchen  Weight loss should be encouraged on an ongoing basis Continue semagluide Outpatient PCP/bariatric medicine/bariatric surgery f/u encouraged       Pain control - [facility] Controlled Substance Reporting System database was reviewed. and patient was instructed, not to drive, operate heavy machinery, perform activities at heights, swimming or participation in water activities or provide baby-sitting services while on Pain, Sleep and Anxiety Medications; until their outpatient Physician has advised to do so again. Also recommended to not to take more than prescribed Pain, Sleep and Anxiety Medications.   Disposition: Home Diet recommendation:  Carb modified diet DISCHARGE MEDICATION: Allergies as of 08/26/2023       Reactions   Lodine [etodolac] Swelling        Medication List     STOP taking these medications    chlorthalidone 25 MG tablet Commonly known as: HYGROTON       TAKE these medications    amLODipine 10 MG tablet Commonly known as: NORVASC Take 1 tablet (10 mg total) by mouth daily. What changed: when to take this   empagliflozin 25 MG Tabs tablet Commonly known as: Jardiance Take 1 tablet (25 mg total) by mouth daily before breakfast.   glipiZIDE 5 MG tablet Commonly known as: GLUCOTROL Take 1 tablet (5 mg total) by mouth 2 (two) times daily.   lisinopril 20  MG tablet Commonly known as: ZESTRIL Take 2 tablets (40 mg total) by mouth at bedtime.   metFORMIN 500 MG 24 hr tablet Commonly known as: GLUCOPHAGE-XR Take 1 tablet (500 mg total) by mouth 2 (two)  times daily with a meal.   potassium chloride SA 20 MEQ tablet Commonly known as: KLOR-CON M Take 2 tablets (40 mEq total) by mouth daily for 3 days. Start taking on: August 27, 2023   Rybelsus 14 MG Tabs Generic drug: Semaglutide Take 1 tablet (14 mg total) by mouth daily.        Discharge Exam:    Subjective: Reports that he was out of town and has had worsening generalized weakness with hypersomnolence following some n/v and sweats prior.  Symptoms have resolved.  He felt well at rest this AM and was able to walk around the ER this afternoon.   Objective: Vitals:   08/26/23 0900 08/26/23 1102  BP: 137/83 (!) 149/98  Pulse: 92   Resp: 18   Temp:    SpO2: 92%     Intake/Output Summary (Last 24 hours) at 08/26/2023 1624 Last data filed at 08/26/2023 1308 Gross per 24 hour  Intake 326.81 ml  Output --  Net 326.81 ml   Filed Weights   08/25/23 1455  Weight: (!) 147.4 kg    Exam:  General:  Appears calm and comfortable and is in NAD Eyes:   EOMI, normal lids, iris ENT:  grossly normal hearing, lips & tongue, mmm Neck:  no LAD, masses or thyromegaly Cardiovascular:  RRR, no m/r/g. No LE edema.  Respiratory:   CTA bilaterally with no wheezes/rales/rhonchi.  Normal respiratory effort. Abdomen:  soft, NT, ND Skin:  no rash or induration seen on limited exam Musculoskeletal:  grossly normal tone BUE/BLE, good ROM, no bony abnormality Psychiatric:  grossly normal mood and affect, speech fluent and appropriate, AOx3 Neurologic:  CN 2-12 grossly intact, moves all extremities in coordinated fashion   Data Reviewed: I have reviewed the patient's lab results since admission.  Pertinent labs for today include:  Na++ 130, baseline about 131 K+ 3.2 -> 2.6 -> 3.2 Glucose 174 Normal CBC    Condition at discharge: improving  The results of significant diagnostics from this hospitalization (including imaging, microbiology, ancillary and laboratory) are listed below for  reference.   Imaging Studies: MR BRAIN WO CONTRAST Result Date: 08/25/2023 CLINICAL DATA:  Acute neurologic deficit. Weakness, dizziness and blurry vision EXAM: MRI HEAD WITHOUT CONTRAST MRI CERVICAL SPINE WITHOUT CONTRAST TECHNIQUE: Multiplanar, multiecho pulse sequences of the brain and surrounding structures, and cervical spine, to include the craniocervical junction and cervicothoracic junction, were obtained without intravenous contrast. COMPARISON:  None Available. FINDINGS: MRI HEAD FINDINGS Brain: No acute infarct, mass effect or extra-axial collection. No acute or chronic hemorrhage. There is multifocal hyperintense T2-weighted signal within the white matter. Parenchymal volume and CSF spaces are normal. The midline structures are normal. Vascular: Normal flow voids. Skull and upper cervical spine: Normal calvarium and skull base. Visualized upper cervical spine and soft tissues are normal. Sinuses/Orbits:Partial opacification of the left maxillary sinus. Normal orbits. MRI CERVICAL SPINE FINDINGS Alignment: Physiologic. Vertebrae: No fracture, evidence of discitis, or bone lesion. Cord: Normal signal and morphology. Posterior Fossa, vertebral arteries, paraspinal tissues: Negative. Disc levels: C1-2: Unremarkable. C2-3: Normal disc space and facet joints. There is no spinal canal stenosis. No neural foraminal stenosis. C3-4: Normal disc space and facet joints. There is no spinal canal stenosis. No neural foraminal stenosis. C4-5: Normal disc  space and facet joints. There is no spinal canal stenosis. No neural foraminal stenosis. C5-6: Small disc bulge. There is no spinal canal stenosis. No neural foraminal stenosis. C6-7: Normal disc space and facet joints. There is no spinal canal stenosis. No neural foraminal stenosis. C7-T1: Normal disc space and facet joints. There is no spinal canal stenosis. No neural foraminal stenosis. IMPRESSION: 1. No acute intracranial abnormality. 2. Multifocal hyperintense  T2-weighted signal within the white matter, nonspecific but most commonly seen in the setting of chronic small vessel ischemia. 3. No spinal canal or neural foraminal stenosis of the cervical spine. Electronically Signed   By: Deatra Robinson M.D.   On: 08/25/2023 21:13   MR Cervical Spine Wo Contrast Result Date: 08/25/2023 CLINICAL DATA:  Acute neurologic deficit. Weakness, dizziness and blurry vision EXAM: MRI HEAD WITHOUT CONTRAST MRI CERVICAL SPINE WITHOUT CONTRAST TECHNIQUE: Multiplanar, multiecho pulse sequences of the brain and surrounding structures, and cervical spine, to include the craniocervical junction and cervicothoracic junction, were obtained without intravenous contrast. COMPARISON:  None Available. FINDINGS: MRI HEAD FINDINGS Brain: No acute infarct, mass effect or extra-axial collection. No acute or chronic hemorrhage. There is multifocal hyperintense T2-weighted signal within the white matter. Parenchymal volume and CSF spaces are normal. The midline structures are normal. Vascular: Normal flow voids. Skull and upper cervical spine: Normal calvarium and skull base. Visualized upper cervical spine and soft tissues are normal. Sinuses/Orbits:Partial opacification of the left maxillary sinus. Normal orbits. MRI CERVICAL SPINE FINDINGS Alignment: Physiologic. Vertebrae: No fracture, evidence of discitis, or bone lesion. Cord: Normal signal and morphology. Posterior Fossa, vertebral arteries, paraspinal tissues: Negative. Disc levels: C1-2: Unremarkable. C2-3: Normal disc space and facet joints. There is no spinal canal stenosis. No neural foraminal stenosis. C3-4: Normal disc space and facet joints. There is no spinal canal stenosis. No neural foraminal stenosis. C4-5: Normal disc space and facet joints. There is no spinal canal stenosis. No neural foraminal stenosis. C5-6: Small disc bulge. There is no spinal canal stenosis. No neural foraminal stenosis. C6-7: Normal disc space and facet joints.  There is no spinal canal stenosis. No neural foraminal stenosis. C7-T1: Normal disc space and facet joints. There is no spinal canal stenosis. No neural foraminal stenosis. IMPRESSION: 1. No acute intracranial abnormality. 2. Multifocal hyperintense T2-weighted signal within the white matter, nonspecific but most commonly seen in the setting of chronic small vessel ischemia. 3. No spinal canal or neural foraminal stenosis of the cervical spine. Electronically Signed   By: Deatra Robinson M.D.   On: 08/25/2023 21:13   CT ANGIO HEAD NECK W WO CM Result Date: 08/25/2023 CLINICAL DATA:  Stroke follow-up EXAM: CT ANGIOGRAPHY HEAD AND NECK WITH AND WITHOUT CONTRAST TECHNIQUE: Multidetector CT imaging of the head and neck was performed using the standard protocol during bolus administration of intravenous contrast. Multiplanar CT image reconstructions and MIPs were obtained to evaluate the vascular anatomy. Carotid stenosis measurements (when applicable) are obtained utilizing NASCET criteria, using the distal internal carotid diameter as the denominator. RADIATION DOSE REDUCTION: This exam was performed according to the departmental dose-optimization program which includes automated exposure control, adjustment of the mA and/or kV according to patient size and/or use of iterative reconstruction technique. CONTRAST:  OMNIPAQUE IOHEXOL 350 MG/ML SOLN COMPARISON:  None Available. FINDINGS: CTA NECK FINDINGS Skeleton: No acute abnormality or high grade bony spinal canal stenosis. Other neck: Normal pharynx, larynx and major salivary glands. No cervical lymphadenopathy. Unremarkable thyroid gland. Upper chest: No pneumothorax or pleural effusion. No  nodules or masses. Aortic arch: There is calcific atherosclerosis of the aortic arch. Normal variant aortic arch branching pattern with the brachiocephalic and left common carotid arteries sharing a common origin. RIGHT carotid system: No dissection, occlusion or aneurysm.  There is calcified atherosclerosis extending into the proximal ICA, resulting in less than 50% stenosis. LEFT carotid system: No dissection, occlusion or aneurysm. There is mixed density atherosclerosis extending into the proximal ICA, resulting in less than 50% stenosis. Vertebral arteries: Codominant configuration. There is no dissection, occlusion or flow-limiting stenosis to the skull base (V1-V3 segments). CTA HEAD FINDINGS POSTERIOR CIRCULATION: Mild bilateral V4 segment vertebral artery calcification without stenosis of significance. No proximal occlusion of the anterior or inferior cerebellar arteries. 2 mm infundibulum at the origin of the right superior cerebellar artery. Basilar artery is normal. Superior cerebellar arteries are normal. Posterior cerebral arteries are normal. ANTERIOR CIRCULATION: Atherosclerotic calcification of the internal carotid arteries at the skull base without hemodynamically significant stenosis. Anterior cerebral arteries are normal. Middle cerebral arteries are normal. Venous sinuses: As permitted by contrast timing, patent. Anatomic variants: None Review of the MIP images confirms the above findings. IMPRESSION: 1. No emergent large vessel occlusion. 2. Bilateral carotid bifurcation atherosclerosis without hemodynamically significant stenosis. Aortic atherosclerosis (ICD10-I70.0). Electronically Signed   By: Deatra Robinson M.D.   On: 08/25/2023 19:18   CT Angio Chest PE W and/or Wo Contrast Result Date: 08/25/2023 CLINICAL DATA:  Pulmonary embolus suspected with low to intermediate probability. Positive D-dimer. Weakness, dizziness, blurry vision, and off balance feeling starting yesterday. Abdominal and flank pain with stone suspected. EXAM: CT ANGIOGRAPHY CHEST CT ABDOMEN AND PELVIS WITH CONTRAST TECHNIQUE: Multidetector CT imaging of the chest was performed using the standard protocol during bolus administration of intravenous contrast. Multiplanar CT image reconstructions  and MIPs were obtained to evaluate the vascular anatomy. Multidetector CT imaging of the abdomen and pelvis was performed using the standard protocol during bolus administration of intravenous contrast. RADIATION DOSE REDUCTION: This exam was performed according to the departmental dose-optimization program which includes automated exposure control, adjustment of the mA and/or kV according to patient size and/or use of iterative reconstruction technique. CONTRAST:  OMNIPAQUE IOHEXOL 350 MG/ML SOLN COMPARISON:  Chest radiograph 08/25/2023. CT abdomen and pelvis 04/12/2006. FINDINGS: CTA CHEST FINDINGS Cardiovascular: Technically adequate study with moderately good opacification of the central and segmental pulmonary arteries. No focal filling defects are demonstrated. No evidence of significant pulmonary embolus. Normal heart size. No pericardial effusions. Normal caliber thoracic aorta. Calcification of the aorta and coronary arteries. Mediastinum/Nodes: Thyroid gland is unremarkable. Esophagus is decompressed. No significant lymphadenopathy. Lungs/Pleura: Motion artifact. Lungs are clear. No pleural effusion or pneumothorax. Musculoskeletal: Degenerative changes in the spine. No acute bony abnormalities. Review of the MIP images confirms the above findings. CT ABDOMEN and PELVIS FINDINGS Hepatobiliary: Diffuse fatty infiltration of the liver. Cholelithiasis with a single stone demonstrated. No gallbladder wall thickening or edema. No bile duct dilatation. Pancreas: Unremarkable. No pancreatic ductal dilatation or surrounding inflammatory changes. Spleen: Normal in size without focal abnormality. Adrenals/Urinary Tract: No adrenal gland nodules. Pelvic location of the left kidney. Nephrograms appear symmetrical. No solid mass lesion identified. No hydronephrosis or hydroureter. Bladder is normal. Stomach/Bowel: Stomach, small bowel, and colon are not abnormally distended. Stool throughout the colon. Appendix  is normal. Vascular/Lymphatic: Aortic atherosclerosis. No enlarged abdominal or pelvic lymph nodes. Reproductive: Prostate is unremarkable. Other: No free air or free fluid in the abdomen. Moderate-sized periumbilical hernia containing fat. Small left inguinal hernia containing  fat. Musculoskeletal: Degenerative changes in the spine. No acute bony abnormalities. Review of the MIP images confirms the above findings. IMPRESSION: 1. No evidence of significant pulmonary embolus. 2. No evidence of active pulmonary disease. 3. Diffuse fatty infiltration of the liver. 4. Pelvic left kidney.  Otherwise normal appearance. 5. Aortic atherosclerosis. 6. Moderate periumbilical hernia with fat. Small left inguinal hernia with fat. Electronically Signed   By: Burman Nieves M.D.   On: 08/25/2023 19:07   CT ABDOMEN PELVIS W CONTRAST Result Date: 08/25/2023 CLINICAL DATA:  Pulmonary embolus suspected with low to intermediate probability. Positive D-dimer. Weakness, dizziness, blurry vision, and off balance feeling starting yesterday. Abdominal and flank pain with stone suspected. EXAM: CT ANGIOGRAPHY CHEST CT ABDOMEN AND PELVIS WITH CONTRAST TECHNIQUE: Multidetector CT imaging of the chest was performed using the standard protocol during bolus administration of intravenous contrast. Multiplanar CT image reconstructions and MIPs were obtained to evaluate the vascular anatomy. Multidetector CT imaging of the abdomen and pelvis was performed using the standard protocol during bolus administration of intravenous contrast. RADIATION DOSE REDUCTION: This exam was performed according to the departmental dose-optimization program which includes automated exposure control, adjustment of the mA and/or kV according to patient size and/or use of iterative reconstruction technique. CONTRAST:  OMNIPAQUE IOHEXOL 350 MG/ML SOLN COMPARISON:  Chest radiograph 08/25/2023. CT abdomen and pelvis 04/12/2006. FINDINGS: CTA CHEST FINDINGS  Cardiovascular: Technically adequate study with moderately good opacification of the central and segmental pulmonary arteries. No focal filling defects are demonstrated. No evidence of significant pulmonary embolus. Normal heart size. No pericardial effusions. Normal caliber thoracic aorta. Calcification of the aorta and coronary arteries. Mediastinum/Nodes: Thyroid gland is unremarkable. Esophagus is decompressed. No significant lymphadenopathy. Lungs/Pleura: Motion artifact. Lungs are clear. No pleural effusion or pneumothorax. Musculoskeletal: Degenerative changes in the spine. No acute bony abnormalities. Review of the MIP images confirms the above findings. CT ABDOMEN and PELVIS FINDINGS Hepatobiliary: Diffuse fatty infiltration of the liver. Cholelithiasis with a single stone demonstrated. No gallbladder wall thickening or edema. No bile duct dilatation. Pancreas: Unremarkable. No pancreatic ductal dilatation or surrounding inflammatory changes. Spleen: Normal in size without focal abnormality. Adrenals/Urinary Tract: No adrenal gland nodules. Pelvic location of the left kidney. Nephrograms appear symmetrical. No solid mass lesion identified. No hydronephrosis or hydroureter. Bladder is normal. Stomach/Bowel: Stomach, small bowel, and colon are not abnormally distended. Stool throughout the colon. Appendix is normal. Vascular/Lymphatic: Aortic atherosclerosis. No enlarged abdominal or pelvic lymph nodes. Reproductive: Prostate is unremarkable. Other: No free air or free fluid in the abdomen. Moderate-sized periumbilical hernia containing fat. Small left inguinal hernia containing fat. Musculoskeletal: Degenerative changes in the spine. No acute bony abnormalities. Review of the MIP images confirms the above findings. IMPRESSION: 1. No evidence of significant pulmonary embolus. 2. No evidence of active pulmonary disease. 3. Diffuse fatty infiltration of the liver. 4. Pelvic left kidney.  Otherwise normal  appearance. 5. Aortic atherosclerosis. 6. Moderate periumbilical hernia with fat. Small left inguinal hernia with fat. Electronically Signed   By: Burman Nieves M.D.   On: 08/25/2023 19:07   CT Head Wo Contrast Result Date: 08/25/2023 CLINICAL DATA:  Headache EXAM: CT HEAD WITHOUT CONTRAST TECHNIQUE: Contiguous axial images were obtained from the base of the skull through the vertex without intravenous contrast. RADIATION DOSE REDUCTION: This exam was performed according to the departmental dose-optimization program which includes automated exposure control, adjustment of the mA and/or kV according to patient size and/or use of iterative reconstruction technique. COMPARISON:  CT brain  10/12/2010 FINDINGS: Brain: No acute territorial infarction, hemorrhage or intracranial mass. Mild white matter hypodensity. Nonenlarged ventricles Vascular: No hyperdense vessels.  No unexpected calcification Skull: Normal. Negative for fracture or focal lesion. Sinuses/Orbits: Moderate mucosal thickening and small fluid level left maxillary sinus. Mild mucosal thickening in the ethmoid sinuses Other: None IMPRESSION: 1. No CT evidence for acute intracranial abnormality. Mild white matter hypodensity, nonspecific but most commonly due to chronic small vessel ischemic change. 2. Left maxillary sinus disease. Electronically Signed   By: Jasmine Pang M.D.   On: 08/25/2023 15:28   DG Chest 2 View Result Date: 08/25/2023 CLINICAL DATA:  Fatigue.  Weakness, dizziness and blurry vision. EXAM: CHEST - 2 VIEW COMPARISON:  07/20/2012 FINDINGS: Stable cardiac enlargement. No pleural fluid, interstitial edema, or airspace consolidation. The visualized osseous structures are unremarkable. IMPRESSION: 1. No acute cardiopulmonary disease. 2. Cardiomegaly. Electronically Signed   By: Signa Kell M.D.   On: 08/25/2023 15:21    Microbiology: Results for orders placed or performed during the hospital encounter of 08/25/23  Resp panel by  RT-PCR (RSV, Flu A&B, Covid) Anterior Nasal Swab     Status: None   Collection Time: 08/25/23  2:54 PM   Specimen: Anterior Nasal Swab  Result Value Ref Range Status   SARS Coronavirus 2 by RT PCR NEGATIVE NEGATIVE Final    Comment: (NOTE) SARS-CoV-2 target nucleic acids are NOT DETECTED.  The SARS-CoV-2 RNA is generally detectable in upper respiratory specimens during the acute phase of infection. The lowest concentration of SARS-CoV-2 viral copies this assay can detect is 138 copies/mL. A negative result does not preclude SARS-Cov-2 infection and should not be used as the sole basis for treatment or other patient management decisions. A negative result may occur with  improper specimen collection/handling, submission of specimen other than nasopharyngeal swab, presence of viral mutation(s) within the areas targeted by this assay, and inadequate number of viral copies(<138 copies/mL). A negative result must be combined with clinical observations, patient history, and epidemiological information. The expected result is Negative.  Fact Sheet for Patients:  BloggerCourse.com  Fact Sheet for Healthcare Providers:  SeriousBroker.it  This test is no t yet approved or cleared by the [place] FDA and  has been authorized for detection and/or diagnosis of SARS-CoV-2 by FDA under an Emergency Use Authorization (EUA). This EUA will remain  in effect (meaning this test can be used) for the duration of the COVID-19 declaration under Section 564(b)(1) of the Act, 21 U.S.C.section 360bbb-3(b)(1), unless the authorization is terminated  or revoked sooner.       Influenza A by PCR NEGATIVE NEGATIVE Final   Influenza B by PCR NEGATIVE NEGATIVE Final    Comment: (NOTE) The Xpert Xpress SARS-CoV-2/FLU/RSV plus assay is intended as an aid in the diagnosis of influenza from Nasopharyngeal swab specimens and should not be used as a sole basis  for treatment. Nasal washings and aspirates are unacceptable for Xpert Xpress SARS-CoV-2/FLU/RSV testing.  Fact Sheet for Patients: BloggerCourse.com  Fact Sheet for Healthcare Providers: SeriousBroker.it  This test is not yet approved or cleared by the [place] FDA and has been authorized for detection and/or diagnosis of SARS-CoV-2 by FDA under an Emergency Use Authorization (EUA). This EUA will remain in effect (meaning this test can be used) for the duration of the COVID-19 declaration under Section 564(b)(1) of the Act, 21 U.S.C. section 360bbb-3(b)(1), unless the authorization is terminated or revoked.     Resp Syncytial Virus by PCR NEGATIVE NEGATIVE Final  Comment: (NOTE) Fact Sheet for Patients: BloggerCourse.com  Fact Sheet for Healthcare Providers: SeriousBroker.it  This test is not yet approved or cleared by the [place] FDA and has been authorized for detection and/or diagnosis of SARS-CoV-2 by FDA under an Emergency Use Authorization (EUA). This EUA will remain in effect (meaning this test can be used) for the duration of the COVID-19 declaration under Section 564(b)(1) of the Act, 21 U.S.C. section 360bbb-3(b)(1), unless the authorization is terminated or revoked.  Performed at [facility], [address] [place], [place] [address]     Labs: CBC: Recent Labs  Lab 08/25/23 1454 08/26/23 0252  WBC 8.8 6.1  NEUTROABS 7.0  --   HGB 14.7 13.6  HCT 42.2 40.2  MCV 79.3* 80.2  PLT 214 188   Basic Metabolic Panel: Recent Labs  Lab 08/25/23 1454 08/26/23 0252 08/26/23 1457  NA 131* 133* 130*  K 3.2* 2.6* 3.2*  CL 90* 94* 93*  CO2 27 25 25   GLUCOSE 175* 143* 174*  BUN 18 17 17   CREATININE 0.94 0.76 0.69  CALCIUM 8.8* 8.4* 8.3*  MG  --  2.2  --   PHOS  --  3.0  --    Liver Function Tests: Recent Labs  Lab 08/25/23 1454  08/26/23 0252  AST 23 22  ALT 22 22  ALKPHOS 53 44  BILITOT 1.2 1.5*  PROT 7.4 6.8  ALBUMIN 3.5 3.2*   CBG: Recent Labs  Lab 08/26/23 0301 08/26/23 0733 08/26/23 1113  GLUCAP 163* 162* 236*    Discharge time spent: greater than 30 minutes.  Signed: Jonah Blue, MD [facility] 08/26/2023

## 2023-08-26 NOTE — Progress Notes (Signed)
Anticoagulation monitoring(Lovenox):  53 yo male ordered Lovenox 40 mg Q24h    Filed Weights   08/25/23 1455  Weight: (!) 147.4 kg (325 lb)   BMI 45.3    Lab Results  Component Value Date   CREATININE 0.94 08/25/2023   CREATININE 0.82 06/10/2023   CREATININE 0.83 03/21/2023   Estimated Creatinine Clearance: 132.3 mL/min (by C-G formula based on SCr of 0.94 mg/dL). Hemoglobin & Hematocrit     Component Value Date/Time   HGB 14.7 08/25/2023 1454   HGB 12.7 (L) 01/08/2013 2212   HCT 42.2 08/25/2023 1454   HCT 36.8 (L) 01/08/2013 2212     Per Protocol for Patient with estCrcl > 30 ml/min and BMI > 30, will transition to Lovenox 72.5 mg Q24h.

## 2023-08-26 NOTE — Care Management Obs Status (Signed)
MEDICARE OBSERVATION STATUS NOTIFICATION   Patient Details  Name: [name] MRN: [id number] Date of Birth: 02/10/1971   Medicare Observation Status Notification Given:  Yes    Sherilyn Banker 08/26/2023, 4:25 PM

## 2023-08-26 NOTE — Progress Notes (Signed)
OT Cancellation Note  Patient Details Name: [name] MRN: [id number] DOB: [birth year]   Cancelled Treatment:    Reason Eval/Treat Not Completed: OT screened, no needs identified, will sign off. Orders received, chart reviewed. Pt states he is at baseline, has been completing ADLs independently in room. Denies weakness or balance difficulties. OT to complete orders.   Jahleah Mariscal L. Khylon Davies, OTR/L  08/26/23, 1:08 PM

## 2023-08-27 ENCOUNTER — Emergency Department: Payer: 59

## 2023-08-27 ENCOUNTER — Emergency Department
Admission: EM | Admit: 2023-08-27 | Discharge: 2023-08-27 | Disposition: A | Discharge status: Home / Self Care | Payer: 59 | Attending: Emergency Medicine | Admitting: Emergency Medicine

## 2023-08-27 ENCOUNTER — Other Ambulatory Visit: Payer: Self-pay

## 2023-08-27 ENCOUNTER — Encounter: Payer: Self-pay | Admitting: Family Medicine

## 2023-08-27 ENCOUNTER — Ambulatory Visit: Payer: 59 | Admitting: Family Medicine

## 2023-08-27 VITALS — BP 114/67 | HR 77 | Resp 18 | Ht 71.0 in | Wt 313.4 lb

## 2023-08-27 DIAGNOSIS — Z20822 Contact with and (suspected) exposure to covid-19: Secondary | ICD-10-CM | POA: Insufficient documentation

## 2023-08-27 DIAGNOSIS — E118 Type 2 diabetes mellitus with unspecified complications: Secondary | ICD-10-CM

## 2023-08-27 DIAGNOSIS — R079 Chest pain, unspecified: Secondary | ICD-10-CM

## 2023-08-27 DIAGNOSIS — E119 Type 2 diabetes mellitus without complications: Secondary | ICD-10-CM | POA: Diagnosis not present

## 2023-08-27 DIAGNOSIS — I10 Essential (primary) hypertension: Secondary | ICD-10-CM | POA: Diagnosis not present

## 2023-08-27 DIAGNOSIS — R531 Weakness: Secondary | ICD-10-CM | POA: Diagnosis present

## 2023-08-27 DIAGNOSIS — R42 Dizziness and giddiness: Secondary | ICD-10-CM | POA: Insufficient documentation

## 2023-08-27 DIAGNOSIS — I499 Cardiac arrhythmia, unspecified: Secondary | ICD-10-CM | POA: Diagnosis not present

## 2023-08-27 DIAGNOSIS — R0789 Other chest pain: Secondary | ICD-10-CM | POA: Diagnosis not present

## 2023-08-27 DIAGNOSIS — J449 Chronic obstructive pulmonary disease, unspecified: Secondary | ICD-10-CM | POA: Diagnosis not present

## 2023-08-27 HISTORY — DX: Chronic obstructive pulmonary disease, unspecified: J44.9

## 2023-08-27 HISTORY — DX: Obesity, unspecified: E66.9

## 2023-08-27 LAB — BASIC METABOLIC PANEL
Anion gap: 12 (ref 5–15)
BUN: 14 mg/dL (ref 6–20)
CO2: 25 mmol/L (ref 22–32)
Calcium: 8.4 mg/dL — ABNORMAL LOW (ref 8.9–10.3)
Chloride: 94 mmol/L — ABNORMAL LOW (ref 98–111)
Creatinine, Ser: 0.66 mg/dL (ref 0.61–1.24)
GFR, Estimated: >60 mL/min (ref >60)
Glucose, Bld: 281 mg/dL — ABNORMAL HIGH (ref 70–99)
Potassium: 3.8 mmol/L (ref 3.5–5.1)
Sodium: 131 mmol/L — ABNORMAL LOW (ref 135–145)

## 2023-08-27 LAB — CBC
HCT: 40.6 % (ref 39.0–52.0)
Hemoglobin: 14 g/dL (ref 13.0–17.0)
MCH: 27.2 pg (ref 26.0–34.0)
MCHC: 34.5 g/dL (ref 30.0–36.0)
MCV: 79 fL — ABNORMAL LOW (ref 80.0–100.0)
Platelets: 229 10*3/uL (ref 150–400)
RBC: 5.14 MIL/uL (ref 4.22–5.81)
RDW: 13.9 % (ref 11.5–15.5)
WBC: 5.4 10*3/uL (ref 4.0–10.5)
nRBC: 0 % (ref 0.0–0.2)

## 2023-08-27 LAB — RESP PANEL BY RT-PCR (RSV, FLU A&B, COVID)  RVPGX2
Influenza A by PCR: NEGATIVE
Influenza B by PCR: NEGATIVE
Resp Syncytial Virus by PCR: NEGATIVE
SARS Coronavirus 2 by RT PCR: NEGATIVE

## 2023-08-27 LAB — GLUCOSE, POCT (MANUAL RESULT ENTRY): POC Glucose: 354 mg/dL — AB (ref 70–99)

## 2023-08-27 LAB — TROPONIN I (HIGH SENSITIVITY)
Troponin I (High Sensitivity): 8 ng/L (ref <18)
Troponin I (High Sensitivity): 7 ng/L (ref <18)

## 2023-08-27 MED ORDER — LORAZEPAM 1 MG PO TABS
1.0000 mg | ORAL_TABLET | Freq: Once | ORAL | Status: AC
Start: 2023-08-27 — End: 2023-08-27
  Administered 2023-08-27: 1 mg via ORAL
  Filled 2023-08-27: qty 1

## 2023-08-27 MED ORDER — LORAZEPAM 0.5 MG PO TABS: 0.5000 mg | ORAL_TABLET | Freq: Every day | ORAL | 0 refills | Status: DC | PRN

## 2023-08-27 NOTE — ED Provider Triage Note (Signed)
 Emergency Medicine Provider Triage Evaluation Note  [name] , a 53 y.o. male  was evaluated in triage.  Pt complains of intermittent chest pain. Here via EMS with squeezing type chest pain 1 hour pta at [facility].  Sent to ED.  Seen in ED Sunday for hypokalemia.    At this time denies pain.    Review of Systems  Positive: CP, + hypokalemia,  Negative: No URI sx, v or d   Physical Exam  BP 115/67 (BP Location: Left Arm)   Pulse 88   Temp 97.8 F (36.6 C) (Oral)   Resp 20   Ht 5' 11 (1.803 m)   Wt (!) 142 kg   SpO2 97%   BMI 43.65 kg/m  Gen:   Awake, no distress   Able to speak in complete sentences.   Resp:  Normal effort  MSK:   Moves extremities without difficulty  Other:    Medical Decision Making  Medically screening exam initiated at 3:06 PM.  Appropriate orders placed.  [name] was informed that the remainder of the evaluation will be completed by another provider, this initial triage assessment does not replace that evaluation, and the importance of remaining in the ED until their evaluation is complete.     Saunders Shona CROME, PA-C 08/27/23 [phone]

## 2023-08-27 NOTE — Assessment & Plan Note (Signed)
 Recent episode of severe chills and shivering, followed by fatigue and weakness. No fever reported. Recent travel to [place]  for a church event. No similar symptoms in travel companions. Recent hospitalization with workup including MRI, CT, and X-rays, all of which were unremarkable. Low potassium and sodium noted on labs. -Continue potassium supplementation as prescribed. -Encourage increased fluid intake. -Plan for EMS transport to ED for further evaluation due to current symptoms of lightheadedness and chest tightness.

## 2023-08-27 NOTE — ED Notes (Signed)
Pt called for repeat trop and no answer

## 2023-08-27 NOTE — Discharge Instructions (Addendum)
 Please follow-up with your doctor for ongoing management and continued monitoring.  Please take your anxiety medication as prescribed if you have further symptoms to see if this helps alleviate any of your symptoms.  Return to the emergency department for any return of significant chest pain trouble breathing or any other symptom personally concerning to yourself.

## 2023-08-27 NOTE — Assessment & Plan Note (Addendum)
 Concern for ACS given recent elevated troponins, chest pressure and diaphoresis.  Risk factors include DM T2, HTN, HLD, Obesity TIMI risk score 2. New symptoms chest pain started yesterday. Recently seen in ED for generalized weakness. Electrolytes repleted. Recommend Cardiology evaluation.

## 2023-08-27 NOTE — ED Triage Notes (Signed)
 Pt to ED from home [facility] for chest tightness since about 1 hour ago when arrived at [facility] appt Pt seen here Sunday, kept overnight, treated for low potassium  EKG normal, 142/74, HR 79, 97% RA  Pt states chest tightness comes and goes, nothing at this time. Pt states he is also intermittently unsteady while walking but is not weak on 1 side. States though that he felt like R face felt heavy this morning but face was symmetrical.   Pt has no facial asymmetry or arm drift. Speech is clear.

## 2023-08-27 NOTE — ED Notes (Signed)
Pt called for repeat trop and vitals

## 2023-08-27 NOTE — Progress Notes (Signed)
 SUBJECTIVE:   Chief Complaint  Patient presents with   Hospitalization Follow-up   HPI Presents for acute visit  Discussed the use of AI scribe software for clinical note transcription with the patient, who gave verbal consent to proceed.  History of Present Illness [name] is a 53 year old male who presents with weakness and chest tightness.  He describes the chest tightness as a 'pin prick' sensation without significant pain or radiation. No shortness of breath. He experienced severe shivering and body spasms starting early Saturday morning while attending a church group event in [place], [place] . He woke up at 1:30 AM feeling extremely cold, as if he had taken an ice bath, and was unable to function properly the following day. Despite these symptoms, he managed to travel back home, sleeping most of the way.  He was evaluated in the emergency room where MRIs, CTs, and x-rays were normal. Blood work revealed low potassium and slightly low sodium levels. He was discharged yesterday after feeling better but reports feeling woozy and lightheaded today. He has been taking potassium tablets and increasing fluid intake.  No fever, nausea, vomiting, diarrhea, or bloody stools. He experienced dry heaving after taking his medication on an empty stomach on Saturday. He reports sweating through his side of the bed twice last night and feeling woozy and lightheaded today. He reports that he felt fine after discharge from hospital and symptoms of increase chest tightness and pressure with lightheadedness beginning today.  Has taking his potassium today and reports Carvedilol and hydrochlorothiazide had been discontinued during hospitalization.  He notes that his blood pressure dropped significantly during a test involving different positions yesterday, and he feels his heart rhythm is irregular, although he is unsure if it is fluttering or skipping beats.  He mentions a history of foot  infection, with a current infection in his toes, but no blood cultures were done during his recent hospital visit.    PERTINENT PMH / PSH: As above  OBJECTIVE:  BP 114/67   Pulse 77   Resp 18   Ht 5' 11 (1.803 m)   Wt (!) 313 lb 6 oz (142.1 kg)   SpO2 97%   BMI 43.71 kg/m    Physical Exam Vitals reviewed.  Constitutional:      General: He is not in acute distress.    Appearance: Normal appearance. He is obese. He is not ill-appearing, toxic-appearing or diaphoretic.  Eyes:     General:        Right eye: No discharge.        Left eye: No discharge.  Cardiovascular:     Rate and Rhythm: Normal rate. Rhythm irregularly irregular.  Pulmonary:     Effort: Pulmonary effort is normal.     Breath sounds: Normal breath sounds.  Abdominal:     General: Bowel sounds are normal.  Musculoskeletal:     Cervical back: Normal range of motion.  Skin:    General: Skin is warm and moist.     Coloration: Skin is ashen.  Neurological:     General: No focal deficit present.     Mental Status: He is alert and oriented to person, place, and time. Mental status is at baseline.     Cranial Nerves: Cranial nerves 2-12 are intact.     Motor: Motor function is intact.  Psychiatric:        Mood and Affect: Mood normal.        Behavior: Behavior normal.  Thought Content: Thought content normal.        Judgment: Judgment normal.           07/05/2023    2:52 PM 06/10/2023    2:34 PM 03/21/2023    3:54 PM 12/03/2022    3:56 PM  Depression screen PHQ 2/9  Decreased Interest 0 0 0 0  Down, Depressed, Hopeless 0 0 0 0  PHQ - 2 Score 0 0 0 0  Altered sleeping 0 0 0 0  Tired, decreased energy 0 0 0 0  Change in appetite 0 0 0 2  Feeling bad or failure about yourself  0 0 0 0  Trouble concentrating 0 0 0 0  Moving slowly or fidgety/restless 0 0 0 0  Suicidal thoughts 0 0 0 0  PHQ-9 Score 0 0 0 2  Difficult doing work/chores Not difficult at all Not difficult at all Not difficult at  all Not difficult at all      07/05/2023    2:52 PM 06/10/2023    2:34 PM 03/21/2023    3:55 PM 12/03/2022    3:56 PM  GAD 7 : Generalized Anxiety Score  Nervous, Anxious, on Edge 0 0 0 0  Control/stop worrying 0 0 0 0  Worry too much - different things 0 0 0 0  Trouble relaxing 0 0 0 0  Restless 0 0 0 0  Easily annoyed or irritable 0 0 0 0  Afraid - awful might happen 0 0 0 0  Total GAD 7 Score 0 0 0 0  Anxiety Difficulty Not difficult at all Not difficult at all Not difficult at all Not difficult at all    ASSESSMENT/PLAN:  Chest pain, unspecified type Assessment & Plan: Concern for ACS given recent elevated troponins, chest pressure and diaphoresis.  Risk factors include DM T2, HTN, HLD, Obesity TIMI risk score 2. New symptoms chest pain started yesterday. Recently seen in ED for generalized weakness. Electrolytes repleted. Recommend Cardiology evaluation.     Irregular heartbeat Assessment & Plan: Irregular rhythm noted on auscultation, concern for possible exacerbation of atrial fibrillation. HR range between 48-98 on palpation. EKG: RBBB, occasional PVC noted, unifocal.  Follow up in ED for cardiac evaluation  Orders: -     EKG 12-Lead  Generalized weakness Assessment & Plan: Recent episode of severe chills and shivering, followed by fatigue and weakness. No fever reported. Recent travel to [place]  for a church event. No similar symptoms in travel companions. Recent hospitalization with workup including MRI, CT, and X-rays, all of which were unremarkable. Low potassium and sodium noted on labs. -Continue potassium supplementation as prescribed. -Encourage increased fluid intake. -Plan for EMS transport to ED for further evaluation due to current symptoms of lightheadedness and chest tightness.   Diabetes mellitus type 2 with complications (HCC) -     POCT glucose (manual entry)   PDMP reviewed  Return if symptoms worsen or fail to improve, for PCP.  Glenys Ferrari, MD

## 2023-08-27 NOTE — Assessment & Plan Note (Addendum)
Irregular rhythm noted on auscultation, concern for possible exacerbation of atrial fibrillation. HR range between 48-98 on palpation. EKG: RBBB, occasional PVC noted, unifocal.  Follow up in ED for cardiac evaluation

## 2023-08-27 NOTE — ED Provider Notes (Signed)
 [facility] Provider Note    Event Date/Time   First MD Initiated Contact with Patient 08/27/23 2118     (approximate)  History   Chief Complaint: Chest Pain  HPI  Benji Poynter is a 53 y.o. male with a past medical history of COPD, diabetes, hypertension, presents to the emergency department for chest discomfort and dizziness.  According to the patient over the past few days he has been experiencing episodes of dizziness and weakness.  Patient has had a significant workup over the last few days including lab work, MRI of the brain and cervical spine, CT scans of the chest abdomen and pelvis.  Only abnormality found was the patient's potassium is low at 2.6 which was repleted and the patient's diuretic was discontinued.  Patient states today he was following up at his doctor when he began feeling some chest discomfort so they sent him to the emergency department for evaluation.  Here the patient denies any discomfort but states it comes and goes.  Patient is worried that his blood pressure has been going up and down and feels like his pulse rate has been fluctuating at times.  Physical Exam   Triage Vital Signs: ED Triage Vitals  Encounter Vitals Group     BP 08/27/23 1504 115/67     Systolic BP Percentile --      Diastolic BP Percentile --      Pulse Rate 08/27/23 1504 88     Resp 08/27/23 1504 20     Temp 08/27/23 1504 97.8 F (36.6 C)     Temp Source 08/27/23 1504 Oral     SpO2 08/27/23 1504 97 %     Weight 08/27/23 1501 (!) 313 lb (142 kg)     Height 08/27/23 1501 5' 11 (1.803 m)     Head Circumference --      Peak Flow --      Pain Score 08/27/23 1501 0     Pain Loc --      Pain Education --      Exclude from Growth Chart --     Most recent vital signs: Vitals:   08/27/23 1504 08/27/23 2233  BP: 115/67 (!) 144/82  Pulse: 88 72  Resp: 20 20  Temp: 97.8 F (36.6 C) 98.2 F (36.8 C)  SpO2: 97% 98%    General: Awake, no  distress.  CV:  Good peripheral perfusion.  Regular rate and rhythm  Resp:  Normal effort.  Equal breath sounds bilaterally.  Abd:  No distention.  Soft, nontender.  No rebound or guarding.  ED Results / Procedures / Treatments   EKG  EKG viewed and interpreted by myself shows what appears to be a sinus rhythm at 88 bpm with a widened QRS, normal axis, largely normal intervals besides slight QTc prolongation.  Occasional PVC.  No ST elevation.  RADIOLOGY  I have reviewed interpret the chest x-ray images.  No consolidation seen on my evaluation. Radiology is read the x-ray is negative.   MEDICATIONS ORDERED IN ED: Medications - No data to display   IMPRESSION / MDM / ASSESSMENT AND PLAN / ED COURSE  I reviewed the triage vital signs and the nursing notes.  Patient's presentation is most consistent with acute presentation with potential threat to life or bodily function.  Patient presents to the emergency department for intermittent chest discomfort intermittent dizziness/lightheadedness which has been ongoing for a few days.  Patient's workup in the emergency department is reassuring  including a normal CBC, reassuring chemistry with a potassium up to 3.8.  Patient troponin is negative x 2.  Patient has had an extensive workup over the last few days including CT scans of the chest abdomen and pelvis including a CTA of the chest that was negative for acute finding or PE, negative MRIs of the brain and cervical spine.  Reassuring lab work besides mild hypokalemia which has been corrected currently 3.8 on today's labs.  Patient states he has had a slight cough as well he did test negative for COVID/flu at his doctor per patient however we will recheck today as a precaution.  Vital signs are reassuring, physical exam reassuring.  Given the patient's extensive reassuring workup I believe the patient could be discharged home with outpatient follow-up.  Patient does admit to some stress and anxiety  regarding his symptoms as he states his siblings passed away at similar ages.  Again given such a reassuring workup I believe the patient will be safe for discharge home but it may be beneficial to place the patient on a short course of an anxiety medication to be used if needed for symptoms to see if this helps.  Otherwise I believe the patient could safely follow-up with his primary care doctor for ongoing workup and management.  Respiratory panel negative.  Will discharge with outpatient follow-up.  Patient agreeable to plan.  FINAL CLINICAL IMPRESSION(S) / ED DIAGNOSES   Weakness Chest pain   Note:  This document was prepared using Dragon voice recognition software and may include unintentional dictation errors.   Dorothyann Drivers, MD 08/27/23 [phone]

## 2023-08-27 NOTE — Patient Instructions (Signed)
 It was a pleasure meeting you today. Thank you for allowing me to take part in your health care.  Our goals for today as we discussed include:  EMS for transport to [facility] ED for cardiac evaluation.  Phone call to Triage RN to notify of transport   If you have any questions or concerns, please do not hesitate to call the office at [phone].  I look forward to our next visit and until then take care and stay safe.  Regards,   Glenys Ferrari, MD   [facility]

## 2023-08-29 ENCOUNTER — Encounter: Payer: Self-pay | Admitting: Family Medicine

## 2023-08-29 ENCOUNTER — Ambulatory Visit: Payer: 59 | Admitting: Family Medicine

## 2023-08-29 VITALS — BP 138/76 | HR 78 | Temp 98.0°F | Resp 18 | Ht 71.0 in | Wt 318.4 lb

## 2023-08-29 DIAGNOSIS — E785 Hyperlipidemia, unspecified: Secondary | ICD-10-CM

## 2023-08-29 DIAGNOSIS — E1169 Type 2 diabetes mellitus with other specified complication: Secondary | ICD-10-CM | POA: Diagnosis not present

## 2023-08-29 DIAGNOSIS — Z7985 Long-term (current) use of injectable non-insulin antidiabetic drugs: Secondary | ICD-10-CM

## 2023-08-29 DIAGNOSIS — E118 Type 2 diabetes mellitus with unspecified complications: Secondary | ICD-10-CM | POA: Diagnosis not present

## 2023-08-29 DIAGNOSIS — I152 Hypertension secondary to endocrine disorders: Secondary | ICD-10-CM | POA: Diagnosis not present

## 2023-08-29 DIAGNOSIS — E1159 Type 2 diabetes mellitus with other circulatory complications: Secondary | ICD-10-CM | POA: Diagnosis not present

## 2023-08-29 DIAGNOSIS — E876 Hypokalemia: Secondary | ICD-10-CM

## 2023-08-29 MED ORDER — TIRZEPATIDE 2.5 MG/0.5ML ~~LOC~~ SOAJ: 2.5000 mg | SUBCUTANEOUS | Status: DC

## 2023-08-29 MED ORDER — TIRZEPATIDE 2.5 MG/0.5ML ~~LOC~~ SOAJ: 2.5000 mg | SUBCUTANEOUS | 0 refills | Status: DC

## 2023-08-29 NOTE — Patient Instructions (Addendum)
 It was a pleasure meeting you today. Thank you for allowing me to take part in your health care.  Our goals for today as we discussed include:  Start Mounjaro  2.5 mg weekly.  Give on Thursday. Your first dose was today during clinic Stop Rybelsus   Continue all other medications  Monitor blood pressure.  Goal <140/90  Rechecking your potassium and sodium today.  Follow up in 4 weeks    This is a list of the screening recommended for you and due dates:  Health Maintenance  Topic Date Due   Eye exam for diabetics  Never done   Zoster (Shingles) Vaccine (1 of 2) 11/11/2023*   Colon Cancer Screening  03/20/2024*   Pneumococcal Vaccination (2 of 2 - PCV) 08/12/2024*   Hemoglobin A1C  12/08/2023   Yearly kidney health urinalysis for diabetes  12/14/2023   Complete foot exam   06/09/2024   Yearly kidney function blood test for diabetes  08/26/2024   DTaP/Tdap/Td vaccine (2 - Td or Tdap) 08/13/2029   Flu Shot  Completed   Hepatitis C Screening  Completed   HIV Screening  Completed   HPV Vaccine  Aged Out   COVID-19 Vaccine  Discontinued  *Topic was postponed. The date shown is not the original due date.      If you have any questions or concerns, please do not hesitate to call the office at [phone].  I look forward to our next visit and until then take care and stay safe.  Regards,   Glenys Ferrari, MD   [facility]

## 2023-08-29 NOTE — Assessment & Plan Note (Addendum)
 Poorly controlled with A1c of 12 on oral medications. Patient has been on Rybelsus  14mg  with persistently elevated blood sugars. Discussed the benefits of injectable GLP-1 agonist therapy and patient is agreeable to this. -Stop Rybelsus  -Continue Glipizide  5 mg BID -Continue Metformin  XR 500 mg BID -Continue Jardiance  25 mg daily. -Start Mounjaro  2.5mg  subcutaneous injection once weekly, with a plan to increase to 5mg  after 4 weeks if tolerated. First dose given today. -Sample of Mounjaro  2.5 mg provided -Continue current oral antidiabetic medications (Jardiance , Glipizide , Metformin ). -Monitor blood sugars at home. -Follow up in 4 weeks to assess response to therapy.

## 2023-08-29 NOTE — Assessment & Plan Note (Signed)
 Patient is currently on Lisinopril  and Amlodipine . Blood pressure was not discussed in detail during the visit. -Continue current antihypertensive medications. -Monitor blood pressure at home. -Check Cmet

## 2023-08-29 NOTE — Progress Notes (Signed)
 SUBJECTIVE:   Chief Complaint  Patient presents with   Follow-up   HPI Presents for hospital follow up  Discussed the use of AI scribe software for clinical note transcription with the patient, who gave verbal consent to proceed.  History of Present Illness [name] is a 53 year old male with diabetes who presents for follow-up on his diabetes management.  He has been managing diabetes with Rybelsus , but his blood sugar levels remain elevated, with morning readings in the early 200s. He has a history of taking Jardiance , glipizide , and metformin  for diabetes management. He needs to find his blood sugar meter at home to monitor his levels more accurately.  He was recently admitted to the hospital, where he received two shots of insulin . He was initially taken to the emergency room due to concerns about his heart, but he did not experience a heart attack. He felt he was discharged too early and needed more fluids and rest before exerting himself.  He is currently taking amlodipine  and lisinopril  for blood pressure management. His blood pressure was 139, which he considers good. He has not been on any diuretics recently and is monitoring his blood pressure at home.  He is consistent with his medication regimen, taking all prescribed medications daily. Potassium chloride  was added this week to his regimen.      PERTINENT PMH / PSH: As above  OBJECTIVE:  BP 138/76   Pulse 78   Temp 98 F (36.7 C)   Resp 18   Ht 5' 11 (1.803 m)   Wt (!) 318 lb 6 oz (144.4 kg)   SpO2 97%   BMI 44.40 kg/m    Physical Exam Vitals reviewed.  Constitutional:      General: He is not in acute distress.    Appearance: Normal appearance. He is obese. He is not ill-appearing, toxic-appearing or diaphoretic.  Eyes:     General:        Right eye: No discharge.        Left eye: No discharge.  Cardiovascular:     Rate and Rhythm: Normal rate and regular rhythm.     Heart sounds:  Normal heart sounds.  Pulmonary:     Effort: Pulmonary effort is normal.     Breath sounds: Normal breath sounds.  Abdominal:     General: Bowel sounds are normal.  Musculoskeletal:        General: Normal range of motion.     Cervical back: Normal range of motion.  Skin:    General: Skin is warm and dry.  Neurological:     Mental Status: He is alert and oriented to person, place, and time. Mental status is at baseline.  Psychiatric:        Mood and Affect: Mood normal.        Behavior: Behavior normal.        Thought Content: Thought content normal.        Judgment: Judgment normal.           08/29/2023    2:24 PM 07/05/2023    2:52 PM 06/10/2023    2:34 PM 03/21/2023    3:54 PM 12/03/2022    3:56 PM  Depression screen PHQ 2/9  Decreased Interest 0 0 0 0 0  Down, Depressed, Hopeless 0 0 0 0 0  PHQ - 2 Score 0 0 0 0 0  Altered sleeping 0 0 0 0 0  Tired, decreased energy 0 0 0 0 0  Change in appetite 0 0 0 0 2  Feeling bad or failure about yourself  0 0 0 0 0  Trouble concentrating 0 0 0 0 0  Moving slowly or fidgety/restless 0 0 0 0 0  Suicidal thoughts 0 0 0 0 0  PHQ-9 Score 0 0 0 0 2  Difficult doing work/chores Not difficult at all Not difficult at all Not difficult at all Not difficult at all Not difficult at all      08/29/2023    2:24 PM 07/05/2023    2:52 PM 06/10/2023    2:34 PM 03/21/2023    3:55 PM  GAD 7 : Generalized Anxiety Score  Nervous, Anxious, on Edge 0 0 0 0  Control/stop worrying 0 0 0 0  Worry too much - different things 0 0 0 0  Trouble relaxing 0 0 0 0  Restless 0 0 0 0  Easily annoyed or irritable 0 0 0 0  Afraid - awful might happen 0 0 0 0  Total GAD 7 Score 0 0 0 0  Anxiety Difficulty Not difficult at all Not difficult at all Not difficult at all Not difficult at all    ASSESSMENT/PLAN:  Diabetes mellitus type 2 with complications [facility]) Assessment & Plan: Poorly controlled with A1c of 12 on oral medications. Patient has been on  Rybelsus  14mg  with persistently elevated blood sugars. Discussed the benefits of injectable GLP-1 agonist therapy and patient is agreeable to this. -Stop Rybelsus  -Continue Glipizide  5 mg BID -Continue Metformin  XR 500 mg BID -Continue Jardiance  25 mg daily. -Start Mounjaro  2.5mg  subcutaneous injection once weekly, with a plan to increase to 5mg  after 4 weeks if tolerated. First dose given today. -Sample of Mounjaro  2.5 mg provided -Continue current oral antidiabetic medications (Jardiance , Glipizide , Metformin ). -Monitor blood sugars at home. -Follow up in 4 weeks to assess response to therapy.  Orders: -     Tirzepatide ; Inject 2.5 mg into the skin once a week. -     Tirzepatide ; Inject 2.5 mg into the skin once a week.  Dispense: 2 mL; Refill: 0 -     Comprehensive metabolic panel  Hypertension associated with diabetes [facility]) Assessment & Plan: Patient is currently on Lisinopril  and Amlodipine . Blood pressure was not discussed in detail during the visit. -Continue current antihypertensive medications. -Monitor blood pressure at home. -Check Cmet  Orders: -     Tirzepatide ; Inject 2.5 mg into the skin once a week. -     Tirzepatide ; Inject 2.5 mg into the skin once a week.  Dispense: 2 mL; Refill: 0  Hyperlipidemia associated with type 2 diabetes mellitus (HCC) Assessment & Plan: Chronic.  Not at goal for DM 2.   Recommend Crestor  20 mg daily, patient declined     Orders: -     Tirzepatide ; Inject 2.5 mg into the skin once a week. -     Tirzepatide ; Inject 2.5 mg into the skin once a week.  Dispense: 2 mL; Refill: 0  Morbid obesity (HCC) -     Tirzepatide ; Inject 2.5 mg into the skin once a week. -     Tirzepatide ; Inject 2.5 mg into the skin once a week.  Dispense: 2 mL; Refill: 0  Hypokalemia Assessment & Plan: Patient is on potassium chloride . Discussed the need to monitor potassium levels due to potential for hyperkalemia. -Continue potassium chloride . -Monitor  potassium levels.     PDMP reviewed  Return in about 4 weeks (around 09/26/2023) for PCP, DM, HTN.  Glenys Ferrari, MD

## 2023-08-29 NOTE — Assessment & Plan Note (Signed)
 Patient is on potassium chloride . Discussed the need to monitor potassium levels due to potential for hyperkalemia. -Continue potassium chloride . -Monitor potassium levels.

## 2023-08-29 NOTE — Assessment & Plan Note (Signed)
Chronic.  Not at goal for DM 2.   Recommend Crestor 20 mg daily, patient declined

## 2023-08-30 LAB — COMPREHENSIVE METABOLIC PANEL
ALT: 39 U/L (ref 0–53)
AST: 29 U/L (ref 0–37)
Albumin: 3.7 g/dL (ref 3.5–5.2)
Alkaline Phosphatase: 61 U/L (ref 39–117)
BUN: 10 mg/dL (ref 6–23)
CO2: 25 meq/L (ref 19–32)
Calcium: 8.8 mg/dL (ref 8.4–10.5)
Chloride: 97 meq/L (ref 96–112)
Creatinine, Ser: 0.69 mg/dL (ref 0.40–1.50)
GFR: 104.81 mL/min (ref >60.00)
Glucose, Bld: 276 mg/dL — ABNORMAL HIGH (ref 70–99)
Potassium: 4.1 meq/L (ref 3.5–5.1)
Sodium: 135 meq/L (ref 135–145)
Total Bilirubin: 0.4 mg/dL (ref 0.2–1.2)
Total Protein: 6.8 g/dL (ref 6.0–8.3)

## 2023-09-05 ENCOUNTER — Other Ambulatory Visit: Payer: Self-pay | Admitting: Family Medicine

## 2023-09-05 DIAGNOSIS — I152 Hypertension secondary to endocrine disorders: Secondary | ICD-10-CM

## 2023-09-05 NOTE — Telephone Encounter (Signed)
Copied from CRM [id number]. Topic: Clinical - Medication Refill >> Sep 05, 2023 12:20 PM Florestine Avers wrote: Most Recent Primary Care Visit:  Provider: Dana Allan  Department: [facility]  Visit Type: OFFICE VISIT  Date: 08/29/2023  Medication: amLODipine (NORVASC) 10 MG tablet  Has the patient contacted their pharmacy? Yes (Agent: If no, request that the patient contact the pharmacy for the refill. If patient does not wish to contact the pharmacy document the reason why and proceed with request.) (Agent: If yes, when and what did the pharmacy advise?)  Is this the correct pharmacy for this prescription? Yes If no, delete pharmacy and type the correct one.  This is the patient's preferred pharmacy:  [facility] [address] ([address]), [place] - [address] [place]) [place] [address] Phone: [phone] Fax: [phone]   Has the prescription been filled recently? No  Is the patient out of the medication? Yes  Has the patient been seen for an appointment in the last year OR does the patient have an upcoming appointment? Yes  Can we respond through MyChart? No  Agent: Please be advised that Rx refills may take up to 3 business days. We ask that you follow-up with your pharmacy.

## 2023-09-05 NOTE — Telephone Encounter (Signed)
This RN called the pharmacy and was advised that the requested medication, amlodipine, was ready for pick-up.

## 2023-10-21 ENCOUNTER — Encounter: Payer: Self-pay | Admitting: Family Medicine

## 2023-10-21 ENCOUNTER — Ambulatory Visit: Payer: 59 | Admitting: Family Medicine

## 2023-10-21 VITALS — BP 154/80 | HR 89 | Temp 97.6°F | Resp 20 | Ht 71.0 in | Wt 316.4 lb

## 2023-10-21 DIAGNOSIS — Z7985 Long-term (current) use of injectable non-insulin antidiabetic drugs: Secondary | ICD-10-CM

## 2023-10-21 DIAGNOSIS — E118 Type 2 diabetes mellitus with unspecified complications: Secondary | ICD-10-CM

## 2023-10-21 DIAGNOSIS — E1169 Type 2 diabetes mellitus with other specified complication: Secondary | ICD-10-CM | POA: Diagnosis not present

## 2023-10-21 DIAGNOSIS — E785 Hyperlipidemia, unspecified: Secondary | ICD-10-CM

## 2023-10-21 DIAGNOSIS — E1159 Type 2 diabetes mellitus with other circulatory complications: Secondary | ICD-10-CM

## 2023-10-21 DIAGNOSIS — Z6841 Body Mass Index (BMI) 40.0 and over, adult: Secondary | ICD-10-CM

## 2023-10-21 DIAGNOSIS — E1165 Type 2 diabetes mellitus with hyperglycemia: Secondary | ICD-10-CM

## 2023-10-21 DIAGNOSIS — I152 Hypertension secondary to endocrine disorders: Secondary | ICD-10-CM

## 2023-10-21 LAB — POCT GLYCOSYLATED HEMOGLOBIN (HGB A1C): Hemoglobin A1C: 9.3 % — AB (ref 4.0–5.6)

## 2023-10-21 MED ORDER — SPIRONOLACTONE 25 MG PO TABS: 12.5000 mg | ORAL_TABLET | Freq: Every day | ORAL | 0 refills | Status: DC

## 2023-10-21 MED ORDER — TIRZEPATIDE 5 MG/0.5ML ~~LOC~~ SOAJ: 5.0000 mg | SUBCUTANEOUS | 0 refills | Status: DC

## 2023-10-21 MED ORDER — ROSUVASTATIN CALCIUM 10 MG PO TABS: 10.0000 mg | ORAL_TABLET | Freq: Every evening | ORAL | 3 refills | Status: DC

## 2023-10-21 NOTE — Patient Instructions (Addendum)
 It was a pleasure meeting you today. Thank you for allowing me to take part in your health care.  Our goals for today as we discussed include:  Increase Mounjaro to 5 mg weekly for 4 weeks.  If tolerating will increase to 7.5 mg after this  Start Spironolactone 12.5 mg daily Continue Zestril 40 mg daily Continue Amlodipine 10 mg daily  Start Crestor 10 mg daily for high cholesterol.   You should pay attention to your hemoglobin A1C.  It is a three month test about your average blood sugar. If the A1C is - <7.0 is great.  That is our goal for treating you. - Between 7.0 and 9.0 is not so good.  We would need to work to do better. - Above 9.0 is terrible.  You would really need to work with [place] to get it under control.    Today's A1C = 9.3, improving  Follow up in 12 weeks    This is a list of the screening recommended for you and due dates:  Health Maintenance  Topic Date Due   Eye exam for diabetics  Never done   Zoster (Shingles) Vaccine (1 of 2) 11/11/2023*   Colon Cancer Screening  03/20/2024*   Pneumococcal Vaccination (2 of 2 - PCV) 08/12/2024*   Yearly kidney health urinalysis for diabetes  12/14/2023   Hemoglobin A1C  04/21/2024   Complete foot exam   06/09/2024   Yearly kidney function blood test for diabetes  08/28/2024   DTaP/Tdap/Td vaccine (2 - Td or Tdap) 08/13/2029   Flu Shot  Completed   Hepatitis C Screening  Completed   HIV Screening  Completed   HPV Vaccine  Aged Out   COVID-19 Vaccine  Discontinued  *Topic was postponed. The date shown is not the original due date.      If you have any questions or concerns, please do not hesitate to call the office at [phone].  I look forward to our next visit and until then take care and stay safe.  Regards,   Dana Allan, MD   [facility]

## 2023-10-21 NOTE — Progress Notes (Signed)
 SUBJECTIVE:   Chief Complaint  Patient presents with   Diabetes    Started on new medication and provider wanted him to follow up.   HPI Presents to clinic for diabetes follow up   Discussed the use of AI scribe software for clinical note transcription with the patient, who gave verbal consent to proceed.  History of Present Illness [name] is a 53 year old male with diabetes who presents for follow-up.  He has been monitoring his blood sugar levels at home, which initially ranged from 160 to 200 mg/dL. Sporadic use of glipizide has helped lower his blood sugar to around 103 mg/dL. His current A1c is 9.3%.  For hypertension, he is taking lisinopril and amlodipine. He took two tablets of lisinopril this morning but did not take his evening dose. Home blood pressure readings have not been high, but today's reading was 150/82 mmHg. Hydrochlorothiazide was discontinued during a recent emergency department visit, and he is not taking potassium supplements.  He notes a recent weight loss of about 8 pounds, with fluctuations between 310 and 316 pounds on his home scale. He attributes this to a decrease in meal size, stating he eats 'more short term frequently than big long term.'  In terms of social history, he is in the process of moving to a new home, which has kept him physically active. He has not visited a podiatrist recently but is frequently on his feet and has no current foot issues. No chest pain, shortness of breath, or swelling in his legs. He also reports no current cuts or infections on his feet.    PERTINENT PMH / PSH: As above  OBJECTIVE:  BP (!) 154/80   Pulse 89   Temp 97.6 F (36.4 C)   Resp 20   Ht 5\' 11"  (1.803 m)   Wt (!) 316 lb 6 oz (143.5 kg)   SpO2 98%   BMI 44.13 kg/m    Physical Exam Vitals reviewed.  Constitutional:      General: He is not in acute distress.    Appearance: Normal appearance. He is obese. He is not ill-appearing,  toxic-appearing or diaphoretic.  Eyes:     General:        Right eye: No discharge.        Left eye: No discharge.  Cardiovascular:     Rate and Rhythm: Normal rate and regular rhythm.     Heart sounds: Normal heart sounds.  Pulmonary:     Effort: Pulmonary effort is normal.     Breath sounds: Normal breath sounds.  Abdominal:     General: Bowel sounds are normal.  Musculoskeletal:        General: Normal range of motion.     Cervical back: Normal range of motion.  Skin:    General: Skin is warm and dry.  Neurological:     Mental Status: He is alert and oriented to person, place, and time. Mental status is at baseline.  Psychiatric:        Mood and Affect: Mood normal.        Behavior: Behavior normal.        Thought Content: Thought content normal.        Judgment: Judgment normal.           10/21/2023    3:20 PM 08/29/2023    2:24 PM 07/05/2023    2:52 PM 06/10/2023    2:34 PM 03/21/2023    3:54 PM  Depression  screen PHQ 2/9  Decreased Interest 0 0 0 0 0  Down, Depressed, Hopeless 0 0 0 0 0  PHQ - 2 Score 0 0 0 0 0  Altered sleeping 0 0 0 0 0  Tired, decreased energy 0 0 0 0 0  Change in appetite 0 0 0 0 0  Feeling bad or failure about yourself  0 0 0 0 0  Trouble concentrating 0 0 0 0 0  Moving slowly or fidgety/restless 0 0 0 0 0  Suicidal thoughts 0 0 0 0 0  PHQ-9 Score 0 0 0 0 0  Difficult doing work/chores Not difficult at all Not difficult at all Not difficult at all Not difficult at all Not difficult at all      10/21/2023    3:20 PM 08/29/2023    2:24 PM 07/05/2023    2:52 PM 06/10/2023    2:34 PM  GAD 7 : Generalized Anxiety Score  Nervous, Anxious, on Edge 0 0 0 0  Control/stop worrying 0 0 0 0  Worry too much - different things 0 0 0 0  Trouble relaxing 0 0 0 0  Restless 0 0 0 0  Easily annoyed or irritable 0 0 0 0  Afraid - awful might happen 0 0 0 0  Total GAD 7 Score 0 0 0 0  Anxiety Difficulty Not difficult at all Not difficult at all Not  difficult at all Not difficult at all    ASSESSMENT/PLAN:  Diabetes mellitus type 2 with complications [facility]) Assessment & Plan: A1c is 9.3%, an improvement but above target. Blood glucose levels range from 160-200 mg/dL, reduced to 130 mg/dL with sporadic glipizide use. Current regimen includes Mounjaro, which will be titrated to improve glycemic control. The goal is to reduce blood glucose levels to below 150 mg/dL and eventually discontinue glipizide. - Increase Mounjaro to 5 mg for four weeks, then to 7.5 mg if tolerated, and finally to 10 mg by June. - Monitor blood glucose levels at home. - Aim to reduce A1c further and potentially discontinue glipizide.  Orders: -     POCT glycosylated hemoglobin (Hb A1C) -     Tirzepatide; Inject 5 mg into the skin once a week.  Dispense: 6 mL; Refill: 0  Hypertension associated with diabetes (HCC) Assessment & Plan: Blood pressure is 150/82 mmHg. Currently on lisinopril and amlodipine. Previous diuretics were discontinued in the emergency department. Spironolactone will be added to better control blood pressure and manage potassium levels. Spironolactone is a potassium-sparing diuretic, and its addition requires monitoring of kidney function and potassium levels due to potential increases from both spironolactone and lisinopril. - Start spironolactone 12.5 mg daily in the morning. - Monitor blood pressure at home. - Schedule a nurse appointment for blood pressure check if unable to monitor at home. - Check kidney function  Orders: -     Tirzepatide; Inject 5 mg into the skin once a week.  Dispense: 6 mL; Refill: 0 -     Spironolactone; Take 0.5 tablets (12.5 mg total) by mouth daily.  Dispense: 90 tablet; Refill: 0  Hyperlipidemia associated with type 2 diabetes mellitus (HCC) Assessment & Plan: Start Crestor 10 mg daily     Orders: -     Tirzepatide; Inject 5 mg into the skin once a week.  Dispense: 6 mL; Refill: 0 -     Rosuvastatin  Calcium; Take 1 tablet (10 mg total) by mouth every evening.  Dispense: 90 tablet; Refill: 3  Morbid obesity (HCC) Assessment & Plan: Weight is 316 lbs, with some fluctuation noted at home. Weight management is crucial for overall health and diabetes control. Home scales are considered more reliable for tracking weight changes. - Encourage continued weight monitoring and lifestyle modifications to support weight loss.     PDMP reviewed  Return in about 12 weeks (around 01/13/2024).  Dana Allan, MD

## 2023-10-27 ENCOUNTER — Encounter: Payer: Self-pay | Admitting: Family Medicine

## 2023-10-27 NOTE — Assessment & Plan Note (Signed)
 Start Crestor 10 mg daily.

## 2023-10-27 NOTE — Assessment & Plan Note (Signed)
 Weight is 316 lbs, with some fluctuation noted at home. Weight management is crucial for overall health and diabetes control. Home scales are considered more reliable for tracking weight changes. - Encourage continued weight monitoring and lifestyle modifications to support weight loss.

## 2023-10-27 NOTE — Assessment & Plan Note (Signed)
 A1c is 9.3%, an improvement but above target. Blood glucose levels range from 160-200 mg/dL, reduced to 161 mg/dL with sporadic glipizide use. Current regimen includes Mounjaro, which will be titrated to improve glycemic control. The goal is to reduce blood glucose levels to below 150 mg/dL and eventually discontinue glipizide. - Increase Mounjaro to 5 mg for four weeks, then to 7.5 mg if tolerated, and finally to 10 mg by June. - Monitor blood glucose levels at home. - Aim to reduce A1c further and potentially discontinue glipizide.

## 2023-10-27 NOTE — Assessment & Plan Note (Signed)
 Blood pressure is 150/82 mmHg. Currently on lisinopril and amlodipine. Previous diuretics were discontinued in the emergency department. Spironolactone will be added to better control blood pressure and manage potassium levels. Spironolactone is a potassium-sparing diuretic, and its addition requires monitoring of kidney function and potassium levels due to potential increases from both spironolactone and lisinopril. - Start spironolactone 12.5 mg daily in the morning. - Monitor blood pressure at home. - Schedule a nurse appointment for blood pressure check if unable to monitor at home. - Check kidney function

## 2023-11-04 ENCOUNTER — Ambulatory Visit: Payer: 59 | Admitting: Family Medicine

## 2023-11-20 ENCOUNTER — Encounter: Payer: Self-pay | Admitting: Family Medicine

## 2023-11-20 ENCOUNTER — Telehealth: Payer: Self-pay

## 2023-11-20 NOTE — Telephone Encounter (Signed)
 I left a voicemail for patient letting him know that Dr. Valli Gaw has had a change in her schedule, so his appointment on 01/13/2024 at 3pm must be rescheduled.  I let patient know that we have moved his appointment to 01/13/2024 at 2:40pm.  I asked patient to please let us  know if this will not work with his schedule.  E2C2 - if patient calls back, please help him reschedule his visit.

## 2023-11-20 NOTE — Telephone Encounter (Signed)
 Patient does not have MyChart, so I mailed a letter to him regarding the schedule change for his appointment with Dr. Valli Gaw.

## 2023-12-12 ENCOUNTER — Telehealth: Payer: Self-pay | Admitting: Family Medicine

## 2023-12-12 ENCOUNTER — Other Ambulatory Visit: Payer: Self-pay

## 2023-12-12 DIAGNOSIS — E118 Type 2 diabetes mellitus with unspecified complications: Secondary | ICD-10-CM

## 2023-12-12 MED ORDER — TIRZEPATIDE 7.5 MG/0.5ML ~~LOC~~ SOAJ
7.5000 mg | SUBCUTANEOUS | 0 refills | Status: DC
  Filled 2023-12-12: qty 2, 28d supply, fill #0

## 2023-12-12 NOTE — Telephone Encounter (Signed)
 Copied from CRM [id number]. Topic: Appointments - Scheduling Inquiry for Clinic >> Dec 12, 2023  9:16 AM [name] wrote: Reason for CRM: Patient called in stating he received a message that he need to reschedule his 06/23 appointment, soonest appointment is in November , patient wanted to know will he still be able to get his medication because he is currently on and the next up in dosage is 7.5 and his pharmacy stated they do not have 7.5   tirzepatide [facility]) 5 MG/0.5ML Pen

## 2023-12-12 NOTE — Telephone Encounter (Signed)
 Left message to return call to our office.

## 2023-12-12 NOTE — Telephone Encounter (Signed)
 Called pt and sent the 7.5 in to [facility] pharmacy since they have it in stock.

## 2023-12-12 NOTE — Telephone Encounter (Signed)
 Copied from CRM [id number]. Topic: Appointments - Scheduling Inquiry for Clinic >> Dec 12, 2023  2:37 PM [name] wrote: Patient was returning a call from [place], stated that he does not have another pharmacy in mind of where he would like this medication sent to. Is OK with what PCP prefers.

## 2023-12-12 NOTE — Telephone Encounter (Signed)
 Left message for patient to call and reschedule June 23rd appointment.  E2C2 please schedule

## 2023-12-13 ENCOUNTER — Other Ambulatory Visit: Payer: Self-pay | Admitting: Family Medicine

## 2023-12-13 ENCOUNTER — Telehealth: Payer: Self-pay

## 2023-12-13 ENCOUNTER — Other Ambulatory Visit: Payer: Self-pay

## 2023-12-13 DIAGNOSIS — E1159 Type 2 diabetes mellitus with other circulatory complications: Secondary | ICD-10-CM

## 2023-12-13 DIAGNOSIS — E118 Type 2 diabetes mellitus with unspecified complications: Secondary | ICD-10-CM

## 2023-12-13 DIAGNOSIS — E1169 Type 2 diabetes mellitus with other specified complication: Secondary | ICD-10-CM

## 2023-12-13 MED ORDER — TIRZEPATIDE 7.5 MG/0.5ML ~~LOC~~ SOAJ: 7.5000 mg | SUBCUTANEOUS | 0 refills | Status: DC

## 2023-12-13 NOTE — Telephone Encounter (Signed)
 Copied from CRM [id number]. Topic: Clinical - Medication Question >> Dec 13, 2023 11:13 AM Bambi Bonine D wrote: Reason for CRM: Pt is requesting to speak wit Loetta Ringer regarding other alternatives pharmacies. Pt stated that the [facility] pharmacy isn't in network with his insurance and it would make the copay more expensive. Pt would like to receive a call back today if possible.

## 2023-12-13 NOTE — Telephone Encounter (Signed)
 2nd attempt to make sure he was ok with the pharmacy that medication was sent to.

## 2023-12-13 NOTE — Telephone Encounter (Signed)
 Left message to return call to our office.

## 2023-12-13 NOTE — Telephone Encounter (Signed)
 Left message to return call to our office.  Called [facility] on [address] and they have it available. Was sent to that pharmacy

## 2023-12-18 NOTE — Telephone Encounter (Signed)
 3rd Attempt to see if pt was about to pick up his medication at the pharmacy.

## 2024-01-13 ENCOUNTER — Ambulatory Visit: Admitting: Family Medicine

## 2024-03-05 ENCOUNTER — Telehealth: Payer: Self-pay | Admitting: Family Medicine

## 2024-03-05 ENCOUNTER — Encounter: Payer: Self-pay | Admitting: Family Medicine

## 2024-03-05 NOTE — Telephone Encounter (Signed)
 Left a message for patient to call and schedule a TOC with either Leron Glance, or Charanpreet Kaur. Also sent a letter out.

## 2024-03-11 ENCOUNTER — Other Ambulatory Visit: Payer: Self-pay

## 2024-03-11 DIAGNOSIS — E1159 Type 2 diabetes mellitus with other circulatory complications: Secondary | ICD-10-CM

## 2024-03-11 DIAGNOSIS — E1169 Type 2 diabetes mellitus with other specified complication: Secondary | ICD-10-CM

## 2024-03-11 DIAGNOSIS — E118 Type 2 diabetes mellitus with unspecified complications: Secondary | ICD-10-CM

## 2024-03-11 MED ORDER — TIRZEPATIDE 7.5 MG/0.5ML ~~LOC~~ SOAJ: 7.5000 mg | SUBCUTANEOUS | 0 refills | Status: DC

## 2024-03-24 ENCOUNTER — Ambulatory Visit: Admitting: Podiatry

## 2024-03-24 DIAGNOSIS — Q666 Other congenital valgus deformities of feet: Secondary | ICD-10-CM | POA: Diagnosis not present

## 2024-03-24 DIAGNOSIS — L97512 Non-pressure chronic ulcer of other part of right foot with fat layer exposed: Secondary | ICD-10-CM | POA: Diagnosis not present

## 2024-03-24 DIAGNOSIS — E118 Type 2 diabetes mellitus with unspecified complications: Secondary | ICD-10-CM

## 2024-03-24 NOTE — Progress Notes (Signed)
 Subjective:  Patient ID: [name], male    DOB: 11/14/1970,  MRN: [id number]  Chief Complaint  Patient presents with   Callouses    Pt stated that he is a diabetic and he has this place on the bottom of his right foot he stated that its like a callus with a scab on it     53 y.o. male presents for wound care.  Patient presents with right submetatarsal 5 ulceration with fat layer exposed.  He states there was a callus going on for quite some time wanted to get it evaluated has not seen it was prior to seeing me denies any other acute complaints pain scale 7 out of 10 dull achy nature to it.  He is a diabetic with last A1c of 9.3.   Review of Systems: Negative except as noted in the HPI. Denies N/V/F/Ch.  Past Medical History:  Diagnosis Date   COPD (chronic obstructive pulmonary disease) (HCC)    I had COPD like 10 years ago   Diabetes mellitus without complication (HCC)    Hypertension    Obesity     Current Outpatient Medications:    amLODipine  (NORVASC ) 10 MG tablet, Take 1 tablet (10 mg total) by mouth daily. (Patient taking differently: Take 10 mg by mouth at bedtime.), Disp: 30 tablet, Rfl: 6   glipiZIDE  (GLUCOTROL ) 5 MG tablet, Take 1 tablet (5 mg total) by mouth 2 (two) times daily., Disp: 60 tablet, Rfl: 3   lisinopril  (ZESTRIL ) 20 MG tablet, Take 2 tablets (40 mg total) by mouth at bedtime., Disp: 60 tablet, Rfl: 3   rosuvastatin  (CRESTOR ) 10 MG tablet, Take 1 tablet (10 mg total) by mouth every evening., Disp: 90 tablet, Rfl: 3   spironolactone  (ALDACTONE ) 25 MG tablet, Take 0.5 tablets (12.5 mg total) by mouth daily., Disp: 90 tablet, Rfl: 0   tirzepatide  (MOUNJARO ) 7.5 MG/0.5ML Pen, Inject 7.5 mg into the skin once a week., Disp: 6 mL, Rfl: 0  Social History   Tobacco Use  Smoking Status Former   Types: Cigarettes  Smokeless Tobacco Never    Allergies  Allergen Reactions   Lodine [Etodolac] Swelling   Objective:  There were no vitals filed for  this visit. There is no height or weight on file to calculate BMI. Constitutional Well developed. Well nourished.  Vascular Dorsalis pedis pulses palpable bilaterally. Posterior tibial pulses palpable bilaterally. Capillary refill normal to all digits.  No cyanosis or clubbing noted. Pedal hair growth normal.  Neurologic Normal speech. Oriented to person, place, and time. Protective sensation absent  Dermatologic Wound Location: Right fifth metatarsal ulcer with fat layer exposed.  Does not probe down to deep tissue or bone.  No purulent drainage noted slight malodor present. Wound Base: Mixed Granular/Fibrotic Peri-wound: Calloused Exudate: Scant/small amount Serosanguinous exudate Wound Measurements: - See below  Orthopedic: No pain to palpation either foot.   Radiographs: None Assessment:   1. Ulcer of right foot with fat layer exposed (HCC)   2. Diabetes mellitus type 2 with complications (HCC)   3. Pes planovalgus    Plan:  Patient was evaluated and treated and all questions answered.  Ulcer right submetatarsal 5 ulceration fat layer exposed -Debridement as below. -Dressed with Betadine wet-to-dry, DSD. -Continue off-loading with surgical shoe.  Pes planovalgus -I explained to patient the etiology of pes planovalgus and relationship with Planter fasciitis and various treatment options were discussed.  Given patient foot structure in the setting of Planter fasciitis I believe patient will  benefit from custom-made orthotics to help control the hindfoot motion support the arch of the foot and take the stress away from plantar fascial.  Patient agrees with the plan like to proceed with orthotics -Patient was casted for orthotics with offload of submetatarsal 5   Procedure: Excisional Debridement of Wound Tool: Sharp chisel blade/tissue nipper Rationale: Removal of non-viable soft tissue from the wound to promote healing.  Anesthesia: none Pre-Debridement Wound  Measurements: 1 cm x 0.8 cm x 0.3 cm  Post-Debridement Wound Measurements: 1.2 cm x 0.9 cm x 0.3 cm  Type of Debridement: Sharp Excisional Tissue Removed: Non-viable soft tissue Blood loss: Minimal (<50cc) Depth of Debridement: subcutaneous tissue. Technique: Sharp excisional debridement to bleeding, viable wound base.  Wound Progress: His main issue evaluation of continue monitor the progression of the wound. Dressing: Dry, sterile, compression dressing. Disposition: Patient tolerated procedure well. Patient to return in 1 week for follow-up.  No follow-ups on file.

## 2024-04-14 ENCOUNTER — Ambulatory Visit: Admitting: Podiatry

## 2024-04-14 DIAGNOSIS — L97512 Non-pressure chronic ulcer of other part of right foot with fat layer exposed: Secondary | ICD-10-CM

## 2024-04-14 DIAGNOSIS — E118 Type 2 diabetes mellitus with unspecified complications: Secondary | ICD-10-CM

## 2024-04-14 NOTE — Progress Notes (Signed)
  Subjective:  Patient ID: [name], male    DOB: 1971-03-15,  MRN: [id number]  Chief Complaint  Patient presents with   Foot Ulcer    Right foot ulcer follow up     53 y.o. male presents for wound care.  Patient presents for follow-up right submetatarsal 5 ulceration he states is doing better.  Denies any other acute complaints, healed itself.   Review of Systems: Negative except as noted in the HPI. Denies N/V/F/Ch.  Past Medical History:  Diagnosis Date   COPD (chronic obstructive pulmonary disease) (HCC)    I had COPD like 10 years ago   Diabetes mellitus without complication (HCC)    Hypertension    Obesity     Current Outpatient Medications:    amLODipine  (NORVASC ) 10 MG tablet, Take 1 tablet (10 mg total) by mouth daily. (Patient taking differently: Take 10 mg by mouth at bedtime.), Disp: 30 tablet, Rfl: 6   glipiZIDE  (GLUCOTROL ) 5 MG tablet, Take 1 tablet (5 mg total) by mouth 2 (two) times daily., Disp: 60 tablet, Rfl: 3   lisinopril  (ZESTRIL ) 20 MG tablet, Take 2 tablets (40 mg total) by mouth at bedtime., Disp: 60 tablet, Rfl: 3   rosuvastatin  (CRESTOR ) 10 MG tablet, Take 1 tablet (10 mg total) by mouth every evening., Disp: 90 tablet, Rfl: 3   spironolactone  (ALDACTONE ) 25 MG tablet, Take 0.5 tablets (12.5 mg total) by mouth daily., Disp: 90 tablet, Rfl: 0   tirzepatide  (MOUNJARO ) 7.5 MG/0.5ML Pen, Inject 7.5 mg into the skin once a week., Disp: 6 mL, Rfl: 0  Social History   Tobacco Use  Smoking Status Former   Types: Cigarettes  Smokeless Tobacco Never    Allergies  Allergen Reactions   Lodine [Etodolac] Swelling   Objective:  There were no vitals filed for this visit. There is no height or weight on file to calculate BMI. Constitutional Well developed. Well nourished.  Vascular Dorsalis pedis pulses palpable bilaterally. Posterior tibial pulses palpable bilaterally. Capillary refill normal to all digits.  No cyanosis or clubbing  noted. Pedal hair growth normal.  Neurologic Normal speech. Oriented to person, place, and time. Protective sensation absent  Dermatologic Right fifth metatarsal ulcer completely reepithelialized no signs of dehiscence noted no complication noted.  No further wound noted.  No infection noted  Orthopedic: No pain to palpation either foot.   Radiographs: None Assessment:   1. Ulcer of right foot with fat layer exposed (HCC)   2. Diabetes mellitus type 2 with complications [facility])     Plan:  Patient was evaluated and treated and all questions answered.  Ulcer right submetatarsal 5 ulceration fat layer exposed - Clinically healed and officially discharged from my care if any foot and ankle issues are in the future he will come back and see me.  Pes planovalgus -I explained to patient the etiology of pes planovalgus and relationship with Planter fasciitis and various treatment options were discussed.  Given patient foot structure in the setting of Planter fasciitis I believe patient will benefit from custom-made orthotics to help control the hindfoot motion support the arch of the foot and take the stress away from plantar fascial.  Patient agrees with the plan like to proceed with orthotics -Patient was casted for orthotics with offload of submetatarsal 5    No follow-ups on file.

## 2024-05-05 ENCOUNTER — Other Ambulatory Visit: Payer: Self-pay

## 2024-05-05 DIAGNOSIS — E1159 Type 2 diabetes mellitus with other circulatory complications: Secondary | ICD-10-CM

## 2024-05-05 MED ORDER — AMLODIPINE BESYLATE 10 MG PO TABS: 10.0000 mg | ORAL_TABLET | Freq: Every day | ORAL | 0 refills | Status: DC

## 2024-05-05 NOTE — Telephone Encounter (Signed)
 Copied from CRM [id number]. Topic: Clinical - Medication Refill >> May 05, 2024  4:30 PM Leah W wrote: Medication:  amLODipine  (NORVASC ) 10 MG tablet (Pt stated he went through his pharmacy to request this rx several days ago. He has run out of the meds a few days ago and is asking for this to be filled as soon as possible.)  Has the patient contacted their pharmacy? Yes (Agent: If no, request that the patient contact the pharmacy for the refill. If patient does not wish to contact the pharmacy document the reason why and proceed with request.) (Agent: If yes, when and what did the pharmacy advise?)  This is the patient's preferred pharmacy:  [facility] [address]), [place] - [address] [place]) [place] [address] Phone: [phone] Fax: [phone]  Is this the correct pharmacy for this prescription? Yes If no, delete pharmacy and type the correct one.   Has the prescription been filled recently? No  Is the patient out of the medication? Yes  Has the patient been seen for an appointment in the last year OR does the patient have an upcoming appointment? Yes  Can we respond through MyChart? No  Agent: Please be advised that Rx refills may take up to 3 business days. We ask that you follow-up with your pharmacy.

## 2024-05-05 NOTE — Telephone Encounter (Signed)
 Medication has ben refilled for 30 days and pt's wife is aware.

## 2024-05-12 ENCOUNTER — Ambulatory Visit: Payer: Self-pay

## 2024-05-12 NOTE — Telephone Encounter (Signed)
 LMTCB to follow up and see if pt was going to the ED.

## 2024-05-12 NOTE — Telephone Encounter (Signed)
 FYI Only or Action Required?: FYI only for provider.  Patient was last seen in primary care on 10/21/2023 by Hope Merle, MD.  Called Nurse Triage reporting Neurologic Problem.  Symptoms began several days ago.  Symptoms are: unchanged.  Triage Disposition: See HCP Within 4 Hours (Or PCP Triage)  Patient/caregiver understands and will follow disposition?: Yes     Copied from CRM [id number]. Topic: Clinical - Red Word Triage >> May 12, 2024 11:38 AM Franky GRADE wrote: Red Word that prompted transfer to Nurse Triage: Patient is calling because he feels like he may have had a stroke, does not know how to explain how he is feeling but he feels off.      Reason for Disposition  [1] Weakness of the face, arm / hand, or leg / foot on one side of the body AND [2] gradual onset (e.g., days to weeks) AND [3] present now  Answer Assessment - Initial Assessment Questions 1. SYMPTOM: What is the main symptom you are concerned about? (e.g., weakness, numbness)     Changes in speech  2. ONSET: When did this start? (e.g., minutes, hours, days; while sleeping)     A few days ago 3. LAST NORMAL: When was the last time you (the patient) were normal (no symptoms)?      Few days ago 4. PATTERN Does this come and go, or has it been constant since it started?  Is it present now?     Constant  5. CARDIAC SYMPTOMS: Have you had any of the following symptoms: chest pain, difficulty breathing, palpitations?     No 6. NEUROLOGIC SYMPTOMS: Have you had any of the following symptoms: headache, dizziness, vision loss, double vision, changes in speech, unsteady on your feet?     Changes in speech, possible left sided facial droop, gait  7. OTHER SYMPTOMS: Do you have any other symptoms?     No  Protocols used: Neurologic Deficit-A-AH

## 2024-05-13 ENCOUNTER — Other Ambulatory Visit: Payer: Self-pay

## 2024-05-13 ENCOUNTER — Emergency Department

## 2024-05-13 ENCOUNTER — Emergency Department
Admission: EM | Admit: 2024-05-13 | Discharge: 2024-05-13 | Discharge status: Against Medical Advice | Attending: Emergency Medicine | Admitting: Emergency Medicine

## 2024-05-13 ENCOUNTER — Encounter: Payer: Self-pay | Admitting: Intensive Care

## 2024-05-13 DIAGNOSIS — R5383 Other fatigue: Secondary | ICD-10-CM | POA: Insufficient documentation

## 2024-05-13 DIAGNOSIS — R29810 Facial weakness: Secondary | ICD-10-CM | POA: Insufficient documentation

## 2024-05-13 DIAGNOSIS — Z5321 Procedure and treatment not carried out due to patient leaving prior to being seen by health care provider: Secondary | ICD-10-CM | POA: Diagnosis not present

## 2024-05-13 DIAGNOSIS — Y900 Blood alcohol level of less than 20 mg/100 ml: Secondary | ICD-10-CM | POA: Diagnosis not present

## 2024-05-13 LAB — DIFFERENTIAL
Abs Immature Granulocytes: 0.02 K/uL (ref 0.00–0.07)
Basophils Absolute: 0.1 K/uL (ref 0.0–0.1)
Basophils Relative: 1 %
Eosinophils Absolute: 0.2 K/uL (ref 0.0–0.5)
Eosinophils Relative: 2 %
Immature Granulocytes: 0 %
Lymphocytes Relative: 32 %
Lymphs Abs: 2.8 K/uL (ref 0.7–4.0)
Monocytes Absolute: 0.6 K/uL (ref 0.1–1.0)
Monocytes Relative: 7 %
Neutro Abs: 5.1 K/uL (ref 1.7–7.7)
Neutrophils Relative %: 58 %

## 2024-05-13 LAB — CBC
HCT: 42.4 % (ref 39.0–52.0)
Hemoglobin: 14.4 g/dL (ref 13.0–17.0)
MCH: 26.7 pg (ref 26.0–34.0)
MCHC: 34 g/dL (ref 30.0–36.0)
MCV: 78.5 fL — ABNORMAL LOW (ref 80.0–100.0)
Platelets: 295 K/uL (ref 150–400)
RBC: 5.4 MIL/uL (ref 4.22–5.81)
RDW: 13.8 % (ref 11.5–15.5)
WBC: 8.9 K/uL (ref 4.0–10.5)
nRBC: 0 % (ref 0.0–0.2)

## 2024-05-13 LAB — COMPREHENSIVE METABOLIC PANEL WITH GFR
ALT: 28 U/L (ref 0–44)
AST: 25 U/L (ref 15–41)
Albumin: 3.4 g/dL — ABNORMAL LOW (ref 3.5–5.0)
Alkaline Phosphatase: 74 U/L (ref 38–126)
Anion gap: 12 (ref 5–15)
BUN: 13 mg/dL (ref 6–20)
CO2: 25 mmol/L (ref 22–32)
Calcium: 9 mg/dL (ref 8.9–10.3)
Chloride: 95 mmol/L — ABNORMAL LOW (ref 98–111)
Creatinine, Ser: 0.61 mg/dL (ref 0.61–1.24)
GFR, Estimated: >60 mL/min (ref >60)
Glucose, Bld: 307 mg/dL — ABNORMAL HIGH (ref 70–99)
Potassium: 3.5 mmol/L (ref 3.5–5.1)
Sodium: 132 mmol/L — ABNORMAL LOW (ref 135–145)
Total Bilirubin: 0.6 mg/dL (ref 0.0–1.2)
Total Protein: 7.7 g/dL (ref 6.5–8.1)

## 2024-05-13 LAB — APTT: aPTT: 28 s (ref 24–36)

## 2024-05-13 LAB — PROTIME-INR
INR: 0.9 (ref 0.8–1.2)
Prothrombin Time: 13 s (ref 11.4–15.2)

## 2024-05-13 LAB — ETHANOL: Alcohol, Ethyl (B): <15 mg/dL (ref <15)

## 2024-05-13 NOTE — ED Triage Notes (Signed)
 Patient c/o fatigue and unsteady gait since Sunday. Patient has noted left sided facial droop

## 2024-05-14 ENCOUNTER — Ambulatory Visit: Payer: Self-pay

## 2024-05-14 NOTE — Telephone Encounter (Signed)
  Reason for Disposition  Health information question, no triage required and triager able to answer question  Answer Assessment - Initial Assessment Questions [name] to ED yesterday, patient states thought he had a stroke, but is fine and functioning. checked himself out due to the wait because was not being seen and sitting there for hours. I advised patient without a doctors notes on labs I cannot clarify these results. Patient has a TOC on 11/3. Educated when to seek emergency care.  1. REASON FOR CALL: What is the main reason for your call? or How can I best help you?     Calling looking to go over lab results from ED.  2. SYMPTOMS : Do you have any symptoms?      Denies  Protocols used: Information Only Call - No Triage-A-AH    Copied from CRM [id number]. Topic: Clinical - Red Word Triage >> May 14, 2024  3:11 PM Ashley R wrote: [facility] that prompted transfer to Nurse Triage: Checked self out of ER (wait). Wants to know results of labs. States drinking through the work day.

## 2024-05-25 ENCOUNTER — Ambulatory Visit

## 2024-05-25 VITALS — BP 140/80 | HR 100 | Temp 97.9°F | Ht 71.0 in | Wt 317.6 lb

## 2024-05-25 DIAGNOSIS — E118 Type 2 diabetes mellitus with unspecified complications: Secondary | ICD-10-CM | POA: Diagnosis not present

## 2024-05-25 DIAGNOSIS — G4733 Obstructive sleep apnea (adult) (pediatric): Secondary | ICD-10-CM

## 2024-05-25 DIAGNOSIS — E876 Hypokalemia: Secondary | ICD-10-CM

## 2024-05-25 DIAGNOSIS — I152 Hypertension secondary to endocrine disorders: Secondary | ICD-10-CM

## 2024-05-25 DIAGNOSIS — E871 Hypo-osmolality and hyponatremia: Secondary | ICD-10-CM

## 2024-05-25 DIAGNOSIS — E1169 Type 2 diabetes mellitus with other specified complication: Secondary | ICD-10-CM

## 2024-05-25 DIAGNOSIS — E1159 Type 2 diabetes mellitus with other circulatory complications: Secondary | ICD-10-CM | POA: Diagnosis not present

## 2024-05-25 DIAGNOSIS — I739 Peripheral vascular disease, unspecified: Secondary | ICD-10-CM

## 2024-05-25 DIAGNOSIS — E785 Hyperlipidemia, unspecified: Secondary | ICD-10-CM

## 2024-05-25 DIAGNOSIS — Z7985 Long-term (current) use of injectable non-insulin antidiabetic drugs: Secondary | ICD-10-CM

## 2024-05-25 MED ORDER — SPIRONOLACTONE 25 MG PO TABS: 12.5000 mg | ORAL_TABLET | Freq: Every day | ORAL | 3 refills | Status: DC

## 2024-05-25 MED ORDER — LISINOPRIL 40 MG PO TABS: 40.0000 mg | ORAL_TABLET | Freq: Every day | ORAL | 3 refills | Status: DC

## 2024-05-25 MED ORDER — AMLODIPINE BESYLATE 10 MG PO TABS: 10.0000 mg | ORAL_TABLET | Freq: Every day | ORAL | 3 refills | Status: AC

## 2024-05-25 MED ORDER — TIRZEPATIDE 7.5 MG/0.5ML ~~LOC~~ SOAJ: 7.5000 mg | SUBCUTANEOUS | 1 refills | Status: DC

## 2024-05-25 MED ORDER — ROSUVASTATIN CALCIUM 20 MG PO TABS: 20.0000 mg | ORAL_TABLET | Freq: Every day | ORAL | 3 refills | Status: AC

## 2024-05-25 NOTE — Progress Notes (Signed)
 Established Patient Office Visit   Subjective  Patient ID: [name], male    DOB: [birth year]  Age: 53 y.o. MRN: [id number]  Chief Complaint  Patient presents with   Establish Care   Hypertension   Fatigue    Discussed the use of AI scribe software for clinical note transcription with the patient, who gave verbal consent to proceed.  History of Present Illness [name] is a 52 year old male with uncontrolled type 2 diabetes diabetes with complications related to diabetes, hypertension, hyperlipidemia, morbid obesity, OSA not compliant with CPAP who presents for transfer of care from previous PCP and chronic medication management.   He is currently only taking Amlodipine  10 mg and Mounjaro  7.5 mg once weekly. He reports due to hypokalemia needing ED evaluation in 08/2023 he has not been taking Lisinopril , spironolactone , glipizide , rosuvastatin  as prescribed by previous PCP.   ED visit on October 22nd due to symptoms of weakness:  A CT scan of the head was performed, which showed mild chronic small vessel disease but no acute stroke.   Uncontrolled type 2 diabetes, urine microalbuminuria positive, LDL not within goal, elevated blood pressure:  - Last A1c:9.3% on 10/21/23, down from 12.8% from 06/10/23.  - Urine microalbuminuria, last 04/24/2021 with elevated alb:cr ratio.  - Dilutional hyponatremia due to elevated blood glucose history  - Previously on  Lisinopril  40 mg, patient not taking Spironolactone  12.5 mg, patient not taking Glipizide  5 mg twice a day, patient not taking Crestor  10 mg, patient not taking  On Mounjaro  (tirzepatide ) 7.5 mg once weekly, which causes constipation and burping. Does not want to increase dose.  He has not been on metformin  since February and questions whether he should restart it.  He has a history of sleep apnea but does not use a CPAP machine, preferring to sleep on his side to avoid issues. He reports snoring at  night.  No alcohol use. He drinks a lot of water. He is allergic to Lodine, which causes significant swelling, and he has been hospitalized for this reaction.  Qualifies for Pneumonia, annual influenza immunization, annual eye exam.     ROS As per HPI    Objective:     BP (!) 140/80 (BP Location: Right Arm, Cuff Size: Large)   Pulse 100   Temp 97.9 F (36.6 C) (Oral)   Ht 5' 11 (1.803 m)   Wt (!) 317 lb 9.6 oz (144.1 kg)   SpO2 98%   BMI 44.30 kg/m      05/25/2024    3:16 PM 10/21/2023    3:20 PM 08/29/2023    2:24 PM  Depression screen PHQ 2/9  Decreased Interest 0 0 0  Down, Depressed, Hopeless 0 0 0  PHQ - 2 Score 0 0 0  Altered sleeping 0 0 0  Tired, decreased energy 3 0 0  Change in appetite 0 0 0  Feeling bad or failure about yourself  0 0 0  Trouble concentrating 0 0 0  Moving slowly or fidgety/restless 0 0 0  Suicidal thoughts 0 0 0  PHQ-9 Score 3 0 0  Difficult doing work/chores Somewhat difficult Not difficult at all Not difficult at all      05/25/2024    3:17 PM 10/21/2023    3:20 PM 08/29/2023    2:24 PM 07/05/2023    2:52 PM  GAD 7 : Generalized Anxiety Score  Nervous, Anxious, on Edge 0 0 0 0  Control/stop worrying 0 0  0 0  Worry too much - different things 0 0 0 0  Trouble relaxing 0 0 0 0  Restless 0 0 0 0  Easily annoyed or irritable 0 0 0 0  Afraid - awful might happen 0 0 0 0  Total GAD 7 Score 0 0 0 0  Anxiety Difficulty Not difficult at all Not difficult at all Not difficult at all Not difficult at all      05/25/2024    3:16 PM 10/21/2023    3:20 PM 08/29/2023    2:24 PM  Depression screen PHQ 2/9  Decreased Interest 0 0 0  Down, Depressed, Hopeless 0 0 0  PHQ - 2 Score 0 0 0  Altered sleeping 0 0 0  Tired, decreased energy 3 0 0  Change in appetite 0 0 0  Feeling bad or failure about yourself  0 0 0  Trouble concentrating 0 0 0  Moving slowly or fidgety/restless 0 0 0  Suicidal thoughts 0 0 0  PHQ-9 Score 3 0 0  Difficult doing  work/chores Somewhat difficult Not difficult at all Not difficult at all      05/25/2024    3:17 PM 10/21/2023    3:20 PM 08/29/2023    2:24 PM 07/05/2023    2:52 PM  GAD 7 : Generalized Anxiety Score  Nervous, Anxious, on Edge 0 0 0 0  Control/stop worrying 0 0 0 0  Worry too much - different things 0 0 0 0  Trouble relaxing 0 0 0 0  Restless 0 0 0 0  Easily annoyed or irritable 0 0 0 0  Afraid - awful might happen 0 0 0 0  Total GAD 7 Score 0 0 0 0  Anxiety Difficulty Not difficult at all Not difficult at all Not difficult at all Not difficult at all   SDOH Screenings   Food Insecurity: No Food Insecurity (12/03/2022)  Housing: Low Risk  (12/03/2022)  Transportation Needs: No Transportation Needs (12/03/2022)  Utilities: Not At Risk (12/03/2022)  Depression (PHQ2-9): Low Risk  (05/25/2024)  Financial Resource Strain: Low Risk  (12/03/2022)  Physical Activity: Sufficiently Active (12/03/2022)  Social Connections: Unknown (12/03/2022)  Stress: No Stress Concern Present (12/03/2022)  Tobacco Use: Low Risk  (05/25/2024)     Physical Exam Constitutional:      General: He is not in acute distress.    Appearance: Normal appearance. He is obese.  HENT:     Head: Normocephalic and atraumatic.     Right Ear: Tympanic membrane normal.     Left Ear: Tympanic membrane normal.     Mouth/Throat:     Mouth: Mucous membranes are moist.  Neck:     Thyroid: No thyroid mass or thyroid tenderness.  Cardiovascular:     Rate and Rhythm: Normal rate and regular rhythm.  Pulmonary:     Effort: Pulmonary effort is normal.     Breath sounds: Normal breath sounds. No wheezing.  Abdominal:     General: Abdomen is protuberant. Bowel sounds are normal.     Palpations: Abdomen is soft.     Tenderness: There is no abdominal tenderness. There is no guarding or rebound.     Comments: Central obesity noted   Musculoskeletal:     Cervical back: Neck supple. No rigidity.     Right lower leg: No edema.      Left lower leg: No edema.  Skin:    General: Skin is warm.  Neurological:  Mental Status: He is alert and oriented to person, place, and time.     Gait: Gait normal.  Psychiatric:        Mood and Affect: Mood normal.        Behavior: Behavior normal.        No results found for any visits on 05/25/24.  The 10-year ASCVD risk score (Arnett DK, et al., 2019) is: 18.3%    CT head on 05/13/2024:  1. No CT evidence for acute intracranial abnormality. 2. Mild chronic small vessel disease of the white matter.  Assessment & Plan:   Assessment & Plan Diabetes mellitus type 2 with complications (HCC) Type 2 diabetes mellitus with proteinuria, uncontrolled blood glucose, dilutional hyponatremia. Counseled patient on adverse health outcome related to diabetes.  A1c target is <7%. - Checked A1c level. - Checked urine microalbumin. - Continue tirzepatide  (Mounjaro ) 7.5 mg once weekly. - Consider starting Farxiga or Jardiance  based on A1c  and urine microalbumin results. Consider restarting Glipizide  if if negative urine microalbumin level if A1c above goal.  - Encouraged home blood glucose monitoring. - Patient declined pneumonia immunization. Plans on updating influenza immunization at work. - Strongly encouraged annual dilated eye exam.  - Follow up in 3 months.  Orders:   tirzepatide  (MOUNJARO ) 7.5 MG/0.5ML Pen; Inject 7.5 mg into the skin once a week.   Hemoglobin A1c   Urine Microalbumin w/creat. ratio  Hypertension associated with diabetes (HCC) Hypertension with small vessel disease. Blood pressure goal is <130/80 mmHg. Lisinopril  recommended for proteinuria and hypertension. - Restarted lisinopril  40 mg once daily. - Continued amlodipine  10 mg at nighttime. - Continued spironolactone  12.5 mg once daily.  - Medication refill sent.  - Checked blood pressure at home. - Encouraged low carbohydrate, low sugar diet. - Encouraged moderate intensity exercise for 30 minutes  daily or 150 minutes weekly.  - Follow up in 3 months with home BP reading.  Orders:   amLODipine  (NORVASC ) 10 MG tablet; Take 1 tablet (10 mg total) by mouth daily.   spironolactone  (ALDACTONE ) 25 MG tablet; Take 0.5 tablets (12.5 mg total) by mouth daily.   tirzepatide  (MOUNJARO ) 7.5 MG/0.5ML Pen; Inject 7.5 mg into the skin once a week.   Comprehensive metabolic panel  Hyperlipidemia associated with type 2 diabetes mellitus (HCC) Goal LDL <70, not taking previously prescribed Crestor  10 mg. Recommend starting Rosuvastatin  20 mg at bedtime. Refill sent.  Orders:   tirzepatide  (MOUNJARO ) 7.5 MG/0.5ML Pen; Inject 7.5 mg into the skin once a week.   rosuvastatin  (CRESTOR ) 20 MG tablet; Take 1 tablet (20 mg total) by mouth daily.  Morbid obesity (HCC) BMI goal <30, ideally 25. Weight loss reduces stroke risk and small vessel disease progression. - Encouraged calorie deficit diet. - Encouraged moderate intensity exercise for 30 minutes daily or 150 minutes weekly.    Small vessel disease Noted on CT head from 05/13/2024, mild. Risk factor reduction measures discussed with the patient.  Goal BP <130/80 mmHg, Goal A1c <7%, goal LDL <70, goal BMI ideally <74.      Hypokalemia Noted on 08/26/23. Reviewed with the patient that this is resolved and reassured him in restarting above mentioned medications for diabetes, hypertension management.     Hyponatremia Chronic, dilutional from hyponatremia. Patient asymptomatic.      OSA (obstructive sleep apnea) Chronic, not compliant with CPAP. Counseled on referral to sleep specialist, patient declined.      I personally spent a total of 45 minutes in the care of the  patient today including preparing to see the patient, getting/reviewing separately obtained history, performing a medically appropriate exam/evaluation, counseling and educating, placing orders, documenting clinical information in the EHR, independently interpreting results, and  communicating results.   Return in about 3 months (around 08/25/2024) for DM, hypertension management .   Luke Shade, MD

## 2024-05-25 NOTE — Patient Instructions (Addendum)
-   Let's restart following medications:  Lisinopril  40 mg once a day Increase Rosuvastatin  to 20 mg at night time Continue Spironolactone  1/2 tablet which is take 12.5 mg once a day  Please check BP at home, bring home BP reading during follow up visit in 3 months.   Goal A1c is <7% Goal LDL is <70 Goal BP is <130/80   Continue Mounjaro  at 7.5 mg once weekly.  Diet: Emphasize whole grains, lean proteins, fruits, and vegetables. Limit processed foods and sugary drinks. Exercise: Aim for 150 minutes of moderate aerobic activity weekly plus strength training twice a week. Weight Loss: Target 5% reduction if overweight. Monitoring: Regular fasting glucose checks; consider HbA1c.

## 2024-05-25 NOTE — Assessment & Plan Note (Signed)
 Chronic, not compliant with CPAP. Counseled on referral to sleep specialist, patient declined.

## 2024-05-25 NOTE — Assessment & Plan Note (Addendum)
 Noted on CT head from 05/13/2024, mild. Risk factor reduction measures discussed with the patient.  Goal BP <130/80 mmHg, Goal A1c <7%, goal LDL <70, goal BMI ideally <74.

## 2024-05-25 NOTE — Assessment & Plan Note (Addendum)
 BMI goal <30, ideally 25. Weight loss reduces stroke risk and small vessel disease progression. - Encouraged calorie deficit diet. - Encouraged moderate intensity exercise for 30 minutes daily or 150 minutes weekly.

## 2024-05-25 NOTE — Assessment & Plan Note (Addendum)
 Hypertension with small vessel disease. Blood pressure goal is <130/80 mmHg. Lisinopril  recommended for proteinuria and hypertension. - Restarted lisinopril  40 mg once daily. - Continued amlodipine  10 mg at nighttime. - Continued spironolactone  12.5 mg once daily.  - Medication refill sent.  - Checked blood pressure at home. - Encouraged low carbohydrate, low sugar diet. - Encouraged moderate intensity exercise for 30 minutes daily or 150 minutes weekly.  - Follow up in 3 months with home BP reading.  Orders:   amLODipine  (NORVASC ) 10 MG tablet; Take 1 tablet (10 mg total) by mouth daily.   spironolactone  (ALDACTONE ) 25 MG tablet; Take 0.5 tablets (12.5 mg total) by mouth daily.   tirzepatide  (MOUNJARO ) 7.5 MG/0.5ML Pen; Inject 7.5 mg into the skin once a week.   Comprehensive metabolic panel

## 2024-05-25 NOTE — Assessment & Plan Note (Signed)
 Chronic, dilutional from hyponatremia. Patient asymptomatic.

## 2024-05-25 NOTE — Assessment & Plan Note (Signed)
 Noted on 08/26/23. Reviewed with the patient that this is resolved and reassured him in restarting above mentioned medications for diabetes, hypertension management.

## 2024-05-25 NOTE — Assessment & Plan Note (Addendum)
 Goal LDL <70, not taking previously prescribed Crestor  10 mg. Recommend starting Rosuvastatin  20 mg at bedtime. Refill sent.  Orders:   tirzepatide  (MOUNJARO ) 7.5 MG/0.5ML Pen; Inject 7.5 mg into the skin once a week.   rosuvastatin  (CRESTOR ) 20 MG tablet; Take 1 tablet (20 mg total) by mouth daily.

## 2024-05-25 NOTE — Assessment & Plan Note (Addendum)
 Type 2 diabetes mellitus with proteinuria, uncontrolled blood glucose, dilutional hyponatremia. Counseled patient on adverse health outcome related to diabetes.  A1c target is <7%. - Checked A1c level. - Checked urine microalbumin. - Continue tirzepatide  (Mounjaro ) 7.5 mg once weekly. - Consider starting Farxiga or Jardiance  based on A1c  and urine microalbumin results. Consider restarting Glipizide  if if negative urine microalbumin level if A1c above goal.  - Encouraged home blood glucose monitoring. - Patient declined pneumonia immunization. Plans on updating influenza immunization at work. - Strongly encouraged annual dilated eye exam.  - Follow up in 3 months.  Orders:   tirzepatide  (MOUNJARO ) 7.5 MG/0.5ML Pen; Inject 7.5 mg into the skin once a week.   Hemoglobin A1c   Urine Microalbumin w/creat. ratio

## 2024-05-26 ENCOUNTER — Ambulatory Visit: Payer: Self-pay

## 2024-05-26 DIAGNOSIS — E1165 Type 2 diabetes mellitus with hyperglycemia: Secondary | ICD-10-CM

## 2024-05-26 DIAGNOSIS — E1129 Type 2 diabetes mellitus with other diabetic kidney complication: Secondary | ICD-10-CM

## 2024-05-26 LAB — COMPREHENSIVE METABOLIC PANEL WITH GFR
ALT: 22 U/L (ref 0–53)
AST: 14 U/L (ref 0–37)
Albumin: 3.8 g/dL (ref 3.5–5.2)
Alkaline Phosphatase: 83 U/L (ref 39–117)
BUN: 13 mg/dL (ref 6–23)
CO2: 27 meq/L (ref 19–32)
Calcium: 9.2 mg/dL (ref 8.4–10.5)
Chloride: 95 meq/L — ABNORMAL LOW (ref 96–112)
Creatinine, Ser: 0.67 mg/dL (ref 0.40–1.50)
GFR: 105.19 mL/min (ref >60.00)
Glucose, Bld: 296 mg/dL — ABNORMAL HIGH (ref 70–99)
Potassium: 3.6 meq/L (ref 3.5–5.1)
Sodium: 132 meq/L — ABNORMAL LOW (ref 135–145)
Total Bilirubin: 0.4 mg/dL (ref 0.2–1.2)
Total Protein: 6.9 g/dL (ref 6.0–8.3)

## 2024-05-26 LAB — MICROALBUMIN / CREATININE URINE RATIO
Creatinine,U: 73.7 mg/dL
Microalb Creat Ratio: 1937.1 mg/g — ABNORMAL HIGH (ref 0.0–30.0)
Microalb, Ur: 142.7 mg/dL — ABNORMAL HIGH (ref 0.0–1.9)

## 2024-05-26 LAB — HEMOGLOBIN A1C: Hgb A1c MFr Bld: 11.9 % — ABNORMAL HIGH (ref 4.6–6.5)

## 2024-05-26 NOTE — Progress Notes (Signed)
 Please reach out to the patient to let him know I reviewed his lab results and have the following recommendations:  A1c shows uncontrolled diabetes at 11.9%, it was 9.3%. Urine is positive for microalbumin suggestive of diabetic kidney disease. I recommend starting medication called Farxiga 10 mg daily to help with diabetes and kidney protection.  Kidney function shows mildly low sodium due to high blood glucose.  Potassium is normalized.  I also recommend endocrinology referral for management of diabetes since we have not been able to achieve goal. I will put in a referral if he is okay with it.   Thank you,  Luke Shade, MD

## 2024-05-26 NOTE — Telephone Encounter (Signed)
 Copied from CRM [id number]. Topic: Clinical - Lab/Test Results >> May 26, 2024  4:52 PM Viola F wrote: Reason for CRM: Patient returned Katielynn Horan's call regarding lab results, I relayed Dr. Graylon note to patient he had no further questions. He needs the Farxiga 10 mg daily sent to his pharmacy as suggested in Dr. Graylon note and he would like to move forward with referral to Endocrinology.

## 2024-05-27 MED ORDER — DAPAGLIFLOZIN PROPANEDIOL 10 MG PO TABS: ORAL_TABLET | ORAL | 0 refills | Status: DC

## 2024-05-27 NOTE — Telephone Encounter (Signed)
 1. Uncontrolled type 2 diabetes mellitus with hyperglycemia (HCC) (Primary) - dapagliflozin propanediol (FARXIGA) 10 MG TABS tablet; Take 5 mg (1/2 tablet) by mouth daily x1 week, then increase to 10 mg daily.  Dispense: 90 tablet; Refill: 0 - Ambulatory referral to Endocrinology  2. Type 2 diabetes mellitus with microalbuminuria (HCC) - dapagliflozin propanediol (FARXIGA) 10 MG TABS tablet; Take 5 mg (1/2 tablet) by mouth daily x1 week, then increase to 10 mg daily.  Dispense: 90 tablet; Refill: 0 - Ambulatory referral to Endocrinology   Luke Shade, MD

## 2024-07-09 ENCOUNTER — Ambulatory Visit
Admission: EM | Admit: 2024-07-09 | Discharge: 2024-07-09 | Attending: Nurse Practitioner | Admitting: Nurse Practitioner

## 2024-07-09 ENCOUNTER — Ambulatory Visit: Payer: Self-pay

## 2024-07-09 ENCOUNTER — Ambulatory Visit

## 2024-07-09 DIAGNOSIS — L089 Local infection of the skin and subcutaneous tissue, unspecified: Secondary | ICD-10-CM | POA: Insufficient documentation

## 2024-07-09 DIAGNOSIS — E11621 Type 2 diabetes mellitus with foot ulcer: Secondary | ICD-10-CM | POA: Insufficient documentation

## 2024-07-09 DIAGNOSIS — E1165 Type 2 diabetes mellitus with hyperglycemia: Secondary | ICD-10-CM | POA: Insufficient documentation

## 2024-07-09 DIAGNOSIS — T148XXA Other injury of unspecified body region, initial encounter: Secondary | ICD-10-CM | POA: Insufficient documentation

## 2024-07-09 DIAGNOSIS — L97512 Non-pressure chronic ulcer of other part of right foot with fat layer exposed: Secondary | ICD-10-CM | POA: Insufficient documentation

## 2024-07-09 LAB — CBC WITH DIFFERENTIAL/PLATELET
Abs Immature Granulocytes: 0.03 K/uL (ref 0.00–0.07)
Basophils Absolute: 0.1 K/uL (ref 0.0–0.1)
Basophils Relative: 1 %
Eosinophils Absolute: 0.3 K/uL (ref 0.0–0.5)
Eosinophils Relative: 2 %
HCT: 39.6 % (ref 39.0–52.0)
Hemoglobin: 13.8 g/dL (ref 13.0–17.0)
Immature Granulocytes: 0 %
Lymphocytes Relative: 25 %
Lymphs Abs: 2.8 K/uL (ref 0.7–4.0)
MCH: 26.9 pg (ref 26.0–34.0)
MCHC: 34.8 g/dL (ref 30.0–36.0)
MCV: 77.2 fL — ABNORMAL LOW (ref 80.0–100.0)
Monocytes Absolute: 0.8 K/uL (ref 0.1–1.0)
Monocytes Relative: 8 %
Neutro Abs: 7.1 K/uL (ref 1.7–7.7)
Neutrophils Relative %: 64 %
Platelets: 324 K/uL (ref 150–400)
RBC: 5.13 MIL/uL (ref 4.22–5.81)
RDW: 13.8 % (ref 11.5–15.5)
WBC: 11.1 K/uL — ABNORMAL HIGH (ref 4.0–10.5)
nRBC: 0 % (ref 0.0–0.2)

## 2024-07-09 MED ORDER — CEFTRIAXONE SODIUM 1 G IJ SOLR
1.0000 g | Freq: Once | INTRAMUSCULAR | Status: AC
  Administered 2024-07-09: 19:00:00 1 g via INTRAMUSCULAR

## 2024-07-09 NOTE — ED Provider Notes (Signed)
 [facility]    CSN: [id number] Arrival date & time: 07/09/24  1623      History   Chief Complaint Chief Complaint  Patient presents with   Foot Pain    HPI [name] is a 53 y.o. male.   Discussed the use of AI scribe software for clinical note transcription with the patient, who gave verbal consent to proceed.   The patient is a male with a history of uncontrolled type 2 diabetes mellitus, hypertension, hyperlipidemia, and obesity who presents for evaluation of a painful right foot wound that has been present for approximately one week and is making ambulation difficult. He reports mild discomfort at the site and has been alternating acetaminophen  and ibuprofen for pain control. He denies any known injury to the foot.  The patient reports a prior podiatry visit with Dr. Tobie during which a callus was removed from what he believed to be the plantar aspect of the first metatarsal head. However, review of podiatry records from visits on 03/24/2024 and 04/14/2024 documents an ulcer located at the right fifth metatarsal head. At that time, findings included exposure of the fat layer, slight malodor, a mixed granular and fibrotic wound base, callused periwound skin, and scant serosanguineous drainage without purulence. He underwent sharp excisional debridement to a bleeding, viable wound base and was treated with Betadine wet-to-dry dressings, with post-debridement wound measurements of 1.2 cm x 0.9 cm x 0.3 cm. On follow-up on 04/14/2024, the ulcer was noted to be completely re-epithelialized without signs of infection, dehiscence, or complication. He was casted for offloading orthotics for the submetatarsal 5 region but reports he does not currently have orthotics.  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 He attempted to contact his podiatrist today but  was advised he may need to see his primary care provider for a referral, which he found confusing given his prior podiatry care. His most recent primary care visit was in November, at which time his hemoglobin A1c was 11.9, indicating poorly controlled diabetes. He denies known diabetic neuropathy. He denies fever, nausea, or vomiting.  The following sections of the patient's history were reviewed and updated as appropriate: allergies, current medications, past family history, past medical history, past social history, past surgical history, and problem list.       Past Medical History:  Diagnosis Date   COPD (chronic obstructive pulmonary disease) (HCC)    I had COPD like 10 years ago   Diabetes mellitus without complication (HCC)    Elevated d-dimer 08/26/2023   Elevated troponin 08/26/2023   Generalized weakness 08/25/2023   Hypertension    Hypertensive urgency 01/11/2013   Obesity     Patient Active Problem List   Diagnosis Date Noted   Small vessel disease 05/25/2024   Irregular heartbeat 08/27/2023   Hyponatremia 08/26/2023   Hypokalemia 08/26/2023   Low mean corpuscular volume (MCV) 08/26/2023   Diabetic ulcer of toe of left foot associated with type 2 diabetes mellitus, limited to breakdown of skin (HCC) 06/19/2023   Diabetic eye exam (HCC) 12/17/2022   Colon cancer screening 12/17/2022   Prostate cancer screening 12/17/2022   Morbid obesity (HCC) 12/03/2022   Hyperlipidemia associated with type 2 diabetes mellitus (HCC) 05/11/2016   Microcytic anemia 12/28/2013   Erectile dysfunction 12/21/2013   Hypertension associated with diabetes (HCC) 12/21/2013   Type 2 diabetes mellitus with microalbuminuria (HCC) 12/21/2013  OSA (obstructive sleep apnea) 01/11/2013    History reviewed. No pertinent surgical history.     Home Medications    Prior to Admission medications  Medication Sig Start Date End Date Taking? Authorizing Provider  amLODipine  (NORVASC ) 10 MG  tablet Take 1 tablet (10 mg total) by mouth daily. 05/25/24   Bair, Kalpana, MD  dapagliflozin  propanediol (FARXIGA ) 10 MG TABS tablet Take 5 mg (1/2 tablet) by mouth daily x1 week, then increase to 10 mg daily. 05/27/24   Bair, Kalpana, MD  glipiZIDE  (GLUCOTROL ) 5 MG tablet Take 1 tablet (5 mg total) by mouth 2 (two) times daily. 07/05/23   Hope Merle, MD  lisinopril  (ZESTRIL ) 40 MG tablet Take 1 tablet (40 mg total) by mouth daily. 05/25/24   Bair, Kalpana, MD  rosuvastatin  (CRESTOR ) 20 MG tablet Take 1 tablet (20 mg total) by mouth daily. 05/25/24   Bair, Kalpana, MD  spironolactone  (ALDACTONE ) 25 MG tablet Take 0.5 tablets (12.5 mg total) by mouth daily. 05/25/24   Bair, Kalpana, MD  tirzepatide  (MOUNJARO ) 7.5 MG/0.5ML Pen Inject 7.5 mg into the skin once a week. 05/25/24   Bair, Kalpana, MD    Family History History reviewed. No pertinent family history.  Social History Social History[1]   Allergies   Lodine [etodolac]   Review of Systems Review of Systems  Constitutional:  Negative for fever.  Skin:  Positive for wound.  All other systems reviewed and are negative.    Physical Exam Triage Vital Signs ED Triage Vitals  Encounter Vitals Group     BP 07/09/24 1725 (!) 185/80     Girls Systolic BP Percentile --      Girls Diastolic BP Percentile --      Boys Systolic BP Percentile --      Boys Diastolic BP Percentile --      Pulse Rate 07/09/24 1725 94     Resp 07/09/24 1725 18     Temp 07/09/24 1725 98 F (36.7 C)     Temp Source 07/09/24 1725 Oral     SpO2 07/09/24 1725 95 %     Weight 07/09/24 1724 (!) 325 lb 4.8 oz (147.6 kg)     Height --      Head Circumference --      Peak Flow --      Pain Score 07/09/24 1723 4     Pain Loc --      Pain Education --      Exclude from Growth Chart --    No data found.  Updated Vital Signs BP (!) 185/80 (BP Location: Right Arm)   Pulse 94   Temp 98 F (36.7 C) (Oral)   Resp 18   Wt (!) 325 lb 4.8 oz (147.6 kg)   SpO2  95%   BMI 45.37 kg/m   Visual Acuity Right Eye Distance:   Left Eye Distance:   Bilateral Distance:    Right Eye Near:   Left Eye Near:    Bilateral Near:     Physical Exam Vitals reviewed.  Constitutional:      General: He is awake. He is not in acute distress.    Appearance: Normal appearance. He is well-developed. He is obese. He is not ill-appearing, toxic-appearing or diaphoretic.  HENT:     Head: Normocephalic.     Right Ear: Hearing normal.     Left Ear: Hearing normal.     Nose: Nose normal.     Mouth/Throat:     Mouth: Mucous  membranes are moist.  Eyes:     General: Vision grossly intact.     Conjunctiva/sclera: Conjunctivae normal.  Cardiovascular:     Rate and Rhythm: Normal rate and regular rhythm.     Heart sounds: Normal heart sounds.  Pulmonary:     Effort: Pulmonary effort is normal.     Breath sounds: Normal breath sounds and air entry.  Musculoskeletal:        General: Normal range of motion.     Cervical back: Normal range of motion and neck supple.  Skin:    General: Skin is warm and dry.     Findings: Wound present.     Comments: An open ulcerative lesion is present on the plantar aspect of the right foot. Measures 2.0 x 1.5 cm in diameter. The wound is circular with a well-defined perimeter and surrounding callused tissue. The wound bed contains dark brown to black necrotic tissue with areas of yellow slough and demonstrates significant depth with a crater-like appearance. No tendon or bone is clearly visualized. There is no active bleeding or drainage; however, a foul odor is noted. The surrounding periwound skin shows no erythema, edema, or signs of acute cellulitis.  Neurological:     General: No focal deficit present.     Mental Status: He is alert and oriented to person, place, and time.  Psychiatric:        Speech: Speech normal.        Behavior: Behavior is cooperative.      UC Treatments / Results  Labs (all labs ordered are listed,  but only abnormal results are displayed) Labs Reviewed  CBC WITH DIFFERENTIAL/PLATELET - Abnormal; Notable for the following components:      Result Value   WBC 11.1 (*)    MCV 77.2 (*)    All other components within normal limits  AEROBIC CULTURE W GRAM STAIN (SUPERFICIAL SPECIMEN)    EKG   Radiology DG Foot Complete Right Result Date: 07/09/2024 EXAM: 3 OR MORE VIEW(S) XRAY OF THE RIGHT FOOT 07/09/2024 07:22:56 PM COMPARISON: None available. CLINICAL HISTORY: wound to the plantar aspect of right foot. history of poorly controlled diabetes FINDINGS: BONES AND JOINTS: No acute fracture. No malalignment. Plantar and dorsal calcaneal spurring. No osseous erosions to suggest osteomyelitis. SOFT TISSUES: Wound within the plantar foot at the level of the metatarsal heads, with soft tissue gas. Diffuse edema throughout the foot, asymmetric and more severe within the forefoot. IMPRESSION: 1. Wound within the plantar foot at the level of the metatarsal heads, with soft tissue gas. 2. No osseous erosions to suggest osteomyelitis. 3. Diffuse edema throughout the foot, asymmetric and more severe within the forefoot. 4. Plantar and dorsal calcaneal spurring. Electronically signed by: Dorethia Molt MD 07/09/2024 07:28 PM EST RP Workstation: [id number]    Procedures Wound Care  Date/Time: 07/09/2024 7:31 PM  Performed by: Iola Lukes, FNP Authorized by: Iola Lukes, FNP   Consent:    Consent obtained:  Verbal   Consent given by:  Patient   Risks, benefits, and alternatives were discussed: yes     Risks discussed:  Infection, pain and bleeding Universal protocol:    Patient identity confirmed:  Verbally with patient Sedation:    Sedation type:  None Anesthesia:    Anesthesia method:  None Procedure details:    Indications comment:  Nonhealing ulcer to the plantar aspect of the right foot with necrotic tissue and slough requiring debridement to promote healing   Wound location:   Foot  Foot location:  Sole of R foot   Debridement performed: Yes     Debridement type: excisional     Debridement level: subcutaneous tissue     Debridement mechanism:  Blade   Devitalized tissue debrided: callus and necrotic debris     Devitalized tissue debrided comment:  See below Post-procedure details:    Procedure completion:  Tolerated well, no immediate complications Comments:     The wound area was cleansed using standard sterile technique. The ulcer was identified on the plantar aspect of the right foot with surrounding callus and necrotic tissue present in the wound bed. Using sterile instruments, sharp excisional debridement was performed to remove nonviable necrotic tissue, slough, and surrounding hyperkeratotic callus down to healthy, viable tissue. Care was taken to preserve surrounding healthy skin. No tendon or bone was visualized during the procedure. Removal of dark necrotic tissue and yellow slough revealed a cleaner wound base with viable, bleeding tissue.  Post-debridement wound measures 3 x 3 cm with a depth of 0.5 cm at the greatest dimension.  No visible tunneling or undermining noted.  No purulent drainage was encountered. Bleeding controlled with direct pressure. The wound was copiously irrigated. Surgicel placed within the wound bed.  Wound covered with nonadherent dressing, right foot wrapped in gauze and secured with Coban.  (including critical care time)    Medications Ordered in UC Medications  cefTRIAXone  (ROCEPHIN ) injection 1 g (1 g Intramuscular Given 07/09/24 1907)    Initial Impression / Assessment and Plan / UC Course  I have reviewed the triage vital signs and the nursing notes.  Pertinent labs & imaging results that were available during my care of the patient were reviewed by me and considered in my medical decision making (see chart for details).     The patient presents with a painful right plantar foot ulcer of approximately one weeks  duration in the setting of poorly controlled type 2 diabetes mellitus. He reports increasing difficulty with ambulation but denies systemic symptoms such as fever, nausea, or vomiting. Examination reveals a deep plantar ulcer with necrotic tissue and malodor but without surrounding erythema, edema, or purulent drainage. Given his significant comorbidities, prior history of diabetic foot ulceration, and current wound characteristics, there is high concern for a complicated diabetic foot ulcer despite the absence of overt systemic toxicity.  Sharp excisional debridement was performed in clinic to remove necrotic tissue, slough, and surrounding callus, resulting in a viable bleeding wound base without exposed tendon or bone. Post-debridement measurements demonstrate a larger wound bed with increased depth, consistent with removal of nonviable tissue. The wound was irrigated and dressed appropriately, and the patient tolerated the procedure well. Plain radiographs of the right foot demonstrate soft tissue gas at the level of the metatarsal heads without bony erosions to suggest osteomyelitis; however, the presence of soft tissue gas in the setting of uncontrolled diabetes raises concern for deeper soft tissue infection requiring advanced imaging and possible surgical intervention.  The patient was treated in clinic with intramuscular ceftriaxone . A complete blood count was obtained, showing a white blood cell count of 11.1. A sample of foul-smelling slough was collected and sent for culture. Given the patients poorly controlled diabetes, wound depth, malodor, and concerning radiographic findings, further evaluation with MRI imaging and likely surgical debridement are indicated and cannot be safely completed in the outpatient setting.  The on-call podiatrist, Dr. Marolyn Honour, was consulted and reviewed the imaging and wound photographs. He recommended that the patient proceed to [facility]  Emergency  Department tonight for MRI evaluation. He advised that the patient be kept NPO and requested notification once imaging results are available, as surgical intervention may be required the following morning if indicated.  These recommendations and the need for urgent escalation of care were discussed in detail with the patient, who expressed understanding and agreed to proceed to the emergency department for further evaluation. Report was called to Elspeth, press photographer at [facility] Emergency Department, at 2025.  The patient presents with symptoms and exam findings that warrant a higher level of care and further evaluation. Given the clinical picture, emergency department (ED) assessment is recommended for advanced diagnostics and management. The patient is currently stable and appropriate for transport via private vehicle. A responsible adult will be driving. The patient is alert, oriented, demonstrates clear mental status, good insight into their illness, and sound judgment in making decisions regarding their care. The risks, rationale, and need for ED evaluation were discussed, and the patient is agreeable to the plan.  Documentation was completed with the aid of voice recognition software. Transcription may contain typographical errors.  Final Clinical Impressions(s) / UC Diagnoses   Final diagnoses:  Wound infection  Diabetic ulcer of right foot associated with type 2 diabetes mellitus, with fat layer exposed, unspecified part of foot (HCC)  Poorly controlled type 2 diabetes mellitus [facility])   Discharge Instructions   None    ED Prescriptions   None    PDMP not reviewed this encounter.     [1]  Social History Tobacco Use   Smoking status: Never    Passive exposure: Past   Smokeless tobacco: Never  Vaping Use   Vaping status: Never Used  Substance Use Topics   Alcohol use: Not Currently   Drug use: Never     Iola Lukes, FNP 07/09/24 2026

## 2024-07-09 NOTE — ED Notes (Signed)
 Patient is being discharged from the Urgent Care and sent to the Emergency Department via private vehicle . Per NP Murrill, patient is in need of higher level of care due to foot wound. Patient is aware and verbalizes understanding of plan of care.  Vitals:   07/09/24 1725  BP: (!) 185/80  Pulse: 94  Resp: 18  Temp: 98 F (36.7 C)  SpO2: 95%

## 2024-07-09 NOTE — ED Triage Notes (Signed)
 Pt present sore with odor on the bottom of his right foot. Pt state it hurts to walk.  Pt noticed a week ago.

## 2024-07-09 NOTE — Telephone Encounter (Signed)
 FYI Only or Action Required?: FYI only for provider: Patient advised OV - but no available appts. Advised UC - patient is agreeable and will go to UC today.  Patient was last seen in primary care on 05/25/2024 by Abbey Bruckner, MD.  Called Nurse Triage reporting No chief complaint on file..  Symptoms began several weeks ago.  Interventions attempted: OTC medications: tylenol , ibuprofen, pain killers.  Symptoms are: gradually worsening.  Triage Disposition: See HCP Within 4 Hours (Or PCP Triage)  Patient/caregiver understands and will follow disposition?:   Copied from CRM #[id number]. Topic: Clinical - Red Word Triage >> Jul 09, 2024  2:11 PM Robinson H wrote: [facility] that prompted transfer to Nurse Triage: Infection in right foot, very painful, on bottom side of foot is discolored Reason for Disposition  [1] Foul-smelling odor from foot AND [2] new or increased  [1] Drainage from foot AND [2] new or increased  Answer Assessment - Initial Assessment Questions Patient called in stating he has an infection on his right foot. Takes ibuprofen, tylenol , pain killers. Working late hours. Pain now 3-4/10. Unsure of cause of wound. Foul smelling drainage, pt cannot describe color, red inside. Patient advised OV - but no available appointments. UC advised as next steps and pt willing to go today 07/09/24.   1. SYMPTOM: What's the main symptom you're concerned about? (e.g., rash, sore, callus, drainage, numbness)     wound 2. LOCATION: Where is the wound located? (e.g., foot/toe, top/bottom, left/right)     Right foot 3. APPEARANCE: What does the area look like? (e.g., normal, red, swollen; size)     red 4. ONSET: When did the  wound start?     Pt had for weeks: 5. PAIN: Is there any pain? If Yes, ask: How bad is it? (Scale: 0-10; none, mild, moderate, severe)     4 6. CAUSE: What do you think is causing the symptoms?     Pt is unsure what caused it 7. BLOOD GLUCOSE: What is  your blood glucose level?      Not assessed 8. USUAL RANGE: What is your blood glucose level usually? (e.g., usual fasting morning value, usual evening value)     Not assessed 9. OTHER SYMPTOMS: Do you have any other symptoms? (e.g., fever, weakness)     denies  Protocols used: Diabetes - Foot Problems and Questions-A-AH

## 2024-07-11 ENCOUNTER — Other Ambulatory Visit: Payer: Self-pay

## 2024-07-11 ENCOUNTER — Inpatient Hospital Stay
Admission: EM | Admit: 2024-07-11 | Discharge: 2024-07-14 | DRG: 617 | Disposition: A | Discharge status: Home Health | Attending: Obstetrics and Gynecology | Admitting: Obstetrics and Gynecology

## 2024-07-11 ENCOUNTER — Emergency Department

## 2024-07-11 DIAGNOSIS — I152 Hypertension secondary to endocrine disorders: Secondary | ICD-10-CM | POA: Diagnosis present

## 2024-07-11 DIAGNOSIS — E1169 Type 2 diabetes mellitus with other specified complication: Secondary | ICD-10-CM | POA: Diagnosis present

## 2024-07-11 DIAGNOSIS — Z79899 Other long term (current) drug therapy: Secondary | ICD-10-CM | POA: Diagnosis not present

## 2024-07-11 DIAGNOSIS — E1165 Type 2 diabetes mellitus with hyperglycemia: Secondary | ICD-10-CM | POA: Diagnosis present

## 2024-07-11 DIAGNOSIS — E785 Hyperlipidemia, unspecified: Secondary | ICD-10-CM | POA: Diagnosis present

## 2024-07-11 DIAGNOSIS — I160 Hypertensive urgency: Secondary | ICD-10-CM | POA: Diagnosis present

## 2024-07-11 DIAGNOSIS — M009 Pyogenic arthritis, unspecified: Secondary | ICD-10-CM | POA: Diagnosis present

## 2024-07-11 DIAGNOSIS — M869 Osteomyelitis, unspecified: Principal | ICD-10-CM | POA: Diagnosis present

## 2024-07-11 DIAGNOSIS — Z7984 Long term (current) use of oral hypoglycemic drugs: Secondary | ICD-10-CM | POA: Diagnosis not present

## 2024-07-11 DIAGNOSIS — E1159 Type 2 diabetes mellitus with other circulatory complications: Secondary | ICD-10-CM | POA: Diagnosis present

## 2024-07-11 DIAGNOSIS — Z6841 Body Mass Index (BMI) 40.0 and over, adult: Secondary | ICD-10-CM | POA: Diagnosis not present

## 2024-07-11 DIAGNOSIS — E876 Hypokalemia: Secondary | ICD-10-CM | POA: Diagnosis present

## 2024-07-11 DIAGNOSIS — J449 Chronic obstructive pulmonary disease, unspecified: Secondary | ICD-10-CM | POA: Diagnosis present

## 2024-07-11 DIAGNOSIS — M86171 Other acute osteomyelitis, right ankle and foot: Secondary | ICD-10-CM | POA: Diagnosis not present

## 2024-07-11 DIAGNOSIS — Q666 Other congenital valgus deformities of feet: Secondary | ICD-10-CM | POA: Diagnosis not present

## 2024-07-11 DIAGNOSIS — E11621 Type 2 diabetes mellitus with foot ulcer: Secondary | ICD-10-CM | POA: Diagnosis present

## 2024-07-11 DIAGNOSIS — E119 Type 2 diabetes mellitus without complications: Secondary | ICD-10-CM

## 2024-07-11 DIAGNOSIS — Z886 Allergy status to analgesic agent status: Secondary | ICD-10-CM

## 2024-07-11 DIAGNOSIS — M868X7 Other osteomyelitis, ankle and foot: Secondary | ICD-10-CM | POA: Diagnosis present

## 2024-07-11 DIAGNOSIS — E1140 Type 2 diabetes mellitus with diabetic neuropathy, unspecified: Secondary | ICD-10-CM | POA: Diagnosis present

## 2024-07-11 DIAGNOSIS — L97512 Non-pressure chronic ulcer of other part of right foot with fat layer exposed: Secondary | ICD-10-CM | POA: Diagnosis present

## 2024-07-11 DIAGNOSIS — Z7985 Long-term (current) use of injectable non-insulin antidiabetic drugs: Secondary | ICD-10-CM

## 2024-07-11 DIAGNOSIS — G4733 Obstructive sleep apnea (adult) (pediatric): Secondary | ICD-10-CM | POA: Diagnosis present

## 2024-07-11 DIAGNOSIS — E118 Type 2 diabetes mellitus with unspecified complications: Secondary | ICD-10-CM

## 2024-07-11 DIAGNOSIS — Z794 Long term (current) use of insulin: Secondary | ICD-10-CM | POA: Diagnosis not present

## 2024-07-11 LAB — COMPREHENSIVE METABOLIC PANEL WITH GFR
ALT: 23 U/L (ref 0–44)
AST: 28 U/L (ref 15–41)
Albumin: 3.6 g/dL (ref 3.5–5.0)
Alkaline Phosphatase: 100 U/L (ref 38–126)
Anion gap: 11 (ref 5–15)
BUN: 8 mg/dL (ref 6–20)
CO2: 24 mmol/L (ref 22–32)
Calcium: 8.7 mg/dL — ABNORMAL LOW (ref 8.9–10.3)
Chloride: 96 mmol/L — ABNORMAL LOW (ref 98–111)
Creatinine, Ser: 0.69 mg/dL (ref 0.61–1.24)
GFR, Estimated: >60 mL/min
Glucose, Bld: 462 mg/dL — ABNORMAL HIGH (ref 70–99)
Potassium: 4 mmol/L (ref 3.5–5.1)
Sodium: 132 mmol/L — ABNORMAL LOW (ref 135–145)
Total Bilirubin: 0.3 mg/dL (ref 0.0–1.2)
Total Protein: 7 g/dL (ref 6.5–8.1)

## 2024-07-11 LAB — CBC
HCT: 40.3 % (ref 39.0–52.0)
Hemoglobin: 13.8 g/dL (ref 13.0–17.0)
MCH: 27.1 pg (ref 26.0–34.0)
MCHC: 34.2 g/dL (ref 30.0–36.0)
MCV: 79 fL — ABNORMAL LOW (ref 80.0–100.0)
Platelets: 302 K/uL (ref 150–400)
RBC: 5.1 MIL/uL (ref 4.22–5.81)
RDW: 13.6 % (ref 11.5–15.5)
WBC: 8.5 K/uL (ref 4.0–10.5)
nRBC: 0 % (ref 0.0–0.2)

## 2024-07-11 LAB — AEROBIC CULTURE W GRAM STAIN (SUPERFICIAL SPECIMEN): Gram Stain: NONE SEEN

## 2024-07-11 LAB — CBC WITH DIFFERENTIAL/PLATELET
Abs Immature Granulocytes: 0.04 K/uL (ref 0.00–0.07)
Basophils Absolute: 0.1 K/uL (ref 0.0–0.1)
Basophils Relative: 1 %
Eosinophils Absolute: 0.3 K/uL (ref 0.0–0.5)
Eosinophils Relative: 3 %
HCT: 41.8 % (ref 39.0–52.0)
Hemoglobin: 13.9 g/dL (ref 13.0–17.0)
Immature Granulocytes: 0 %
Lymphocytes Relative: 22 %
Lymphs Abs: 2 K/uL (ref 0.7–4.0)
MCH: 26.6 pg (ref 26.0–34.0)
MCHC: 33.3 g/dL (ref 30.0–36.0)
MCV: 80.1 fL (ref 80.0–100.0)
Monocytes Absolute: 0.6 K/uL (ref 0.1–1.0)
Monocytes Relative: 6 %
Neutro Abs: 6.2 K/uL (ref 1.7–7.7)
Neutrophils Relative %: 68 %
Platelets: 328 K/uL (ref 150–400)
RBC: 5.22 MIL/uL (ref 4.22–5.81)
RDW: 13.6 % (ref 11.5–15.5)
WBC: 9.2 K/uL (ref 4.0–10.5)
nRBC: 0 % (ref 0.0–0.2)

## 2024-07-11 LAB — CREATININE, SERUM
Creatinine, Ser: 0.59 mg/dL — ABNORMAL LOW (ref 0.61–1.24)
GFR, Estimated: >60 mL/min

## 2024-07-11 LAB — SEDIMENTATION RATE: Sed Rate: 41 mm/h — ABNORMAL HIGH (ref 0–20)

## 2024-07-11 LAB — LACTIC ACID, PLASMA
Lactic Acid, Venous: 2.9 mmol/L (ref 0.5–1.9)
Lactic Acid, Venous: 2.1 mmol/L (ref 0.5–1.9)

## 2024-07-11 LAB — CBG MONITORING, ED: Glucose-Capillary: 270 mg/dL — ABNORMAL HIGH (ref 70–99)

## 2024-07-11 MED ORDER — LISINOPRIL 20 MG PO TABS
40.0000 mg | ORAL_TABLET | Freq: Every day | ORAL | Status: DC
  Administered 2024-07-12 – 2024-07-14 (×2): 40 mg via ORAL
  Filled 2024-07-11: qty 4
  Filled 2024-07-11: qty 2

## 2024-07-11 MED ORDER — VANCOMYCIN HCL 500 MG/100ML IV SOLN
500.0000 mg | Freq: Once | INTRAVENOUS | Status: AC
  Administered 2024-07-12: 500 mg via INTRAVENOUS
  Filled 2024-07-11: qty 100

## 2024-07-11 MED ORDER — SPIRONOLACTONE 12.5 MG HALF TABLET
12.5000 mg | ORAL_TABLET | Freq: Every day | ORAL | Status: DC
  Administered 2024-07-12: 12.5 mg via ORAL
  Filled 2024-07-11: qty 1

## 2024-07-11 MED ORDER — ACETAMINOPHEN 650 MG RE SUPP: 650.0000 mg | Freq: Four times a day (QID) | RECTAL | Status: DC | PRN

## 2024-07-11 MED ORDER — POLYETHYLENE GLYCOL 3350 17 G PO PACK: 17.0000 g | PACK | Freq: Every day | ORAL | Status: DC | PRN

## 2024-07-11 MED ORDER — PIPERACILLIN-TAZOBACTAM 3.375 G IVPB 30 MIN
3.3750 g | Freq: Once | INTRAVENOUS | Status: AC
  Administered 2024-07-11: 3.375 g via INTRAVENOUS
  Filled 2024-07-11: qty 50

## 2024-07-11 MED ORDER — PIPERACILLIN-TAZOBACTAM 3.375 G IVPB
3.3750 g | Freq: Three times a day (TID) | INTRAVENOUS | Status: DC
  Administered 2024-07-12 – 2024-07-13 (×4): 3.375 g via INTRAVENOUS
  Filled 2024-07-11 (×4): qty 50

## 2024-07-11 MED ORDER — INSULIN ASPART 100 UNIT/ML IJ SOLN
0.0000 [IU] | INTRAMUSCULAR | Status: DC
  Administered 2024-07-11 – 2024-07-12 (×2): 8 [IU] via SUBCUTANEOUS
  Administered 2024-07-12: 5 [IU] via SUBCUTANEOUS
  Administered 2024-07-12 (×2): 3 [IU] via SUBCUTANEOUS
  Administered 2024-07-12 – 2024-07-13 (×2): 5 [IU] via SUBCUTANEOUS
  Administered 2024-07-13: 8 [IU] via SUBCUTANEOUS
  Administered 2024-07-13 – 2024-07-14 (×6): 3 [IU] via SUBCUTANEOUS
  Filled 2024-07-11: qty 8
  Filled 2024-07-11: qty 3
  Filled 2024-07-11: qty 5
  Filled 2024-07-11 (×2): qty 3
  Filled 2024-07-11: qty 5
  Filled 2024-07-11 (×3): qty 3
  Filled 2024-07-11: qty 8
  Filled 2024-07-11: qty 5
  Filled 2024-07-11: qty 8
  Filled 2024-07-11 (×2): qty 3

## 2024-07-11 MED ORDER — ROSUVASTATIN CALCIUM 20 MG PO TABS
20.0000 mg | ORAL_TABLET | Freq: Every day | ORAL | Status: DC
  Administered 2024-07-12 – 2024-07-14 (×2): 20 mg via ORAL
  Filled 2024-07-11 (×2): qty 1

## 2024-07-11 MED ORDER — LACTATED RINGERS IV BOLUS
1000.0000 mL | Freq: Once | INTRAVENOUS | Status: AC
  Administered 2024-07-11: 1000 mL via INTRAVENOUS

## 2024-07-11 MED ORDER — AMLODIPINE BESYLATE 10 MG PO TABS
10.0000 mg | ORAL_TABLET | Freq: Every day | ORAL | Status: DC
  Administered 2024-07-12 – 2024-07-14 (×2): 10 mg via ORAL
  Filled 2024-07-11: qty 1
  Filled 2024-07-11: qty 2

## 2024-07-11 MED ORDER — ACETAMINOPHEN 325 MG PO TABS
650.0000 mg | ORAL_TABLET | Freq: Four times a day (QID) | ORAL | Status: DC | PRN
  Administered 2024-07-12 – 2024-07-13 (×2): 650 mg via ORAL
  Filled 2024-07-11 (×2): qty 2

## 2024-07-11 MED ORDER — VANCOMYCIN HCL 2000 MG/400ML IV SOLN
2000.0000 mg | Freq: Once | INTRAVENOUS | Status: AC
  Administered 2024-07-11: 2000 mg via INTRAVENOUS
  Filled 2024-07-11: qty 400

## 2024-07-11 MED ORDER — DAPAGLIFLOZIN PROPANEDIOL 10 MG PO TABS
10.0000 mg | ORAL_TABLET | Freq: Every day | ORAL | Status: DC
  Administered 2024-07-12: 10 mg via ORAL
  Filled 2024-07-11: qty 1

## 2024-07-11 MED ORDER — ENOXAPARIN SODIUM 80 MG/0.8ML IJ SOSY
0.5000 mg/kg | PREFILLED_SYRINGE | INTRAMUSCULAR | Status: DC
  Administered 2024-07-11: 72.5 mg via SUBCUTANEOUS
  Filled 2024-07-11: qty 0.72

## 2024-07-11 MED ORDER — VANCOMYCIN HCL 1750 MG/350ML IV SOLN
1750.0000 mg | Freq: Two times a day (BID) | INTRAVENOUS | Status: DC
  Administered 2024-07-12 (×2): 1750 mg via INTRAVENOUS
  Filled 2024-07-11 (×3): qty 350

## 2024-07-11 MED ORDER — INSULIN GLARGINE 100 UNIT/ML ~~LOC~~ SOLN
10.0000 [IU] | Freq: Every day | SUBCUTANEOUS | Status: DC
  Administered 2024-07-11 – 2024-07-12 (×2): 10 [IU] via SUBCUTANEOUS
  Filled 2024-07-11 (×3): qty 0.1

## 2024-07-11 NOTE — Progress Notes (Signed)
 Anticoagulation monitoring(Lovenox ):  53 yo male ordered Lovenox  40 mg Q24h    Filed Weights   07/11/24 1422  Weight: (!) 147 kg (324 lb)   BMI 45.2    Lab Results  Component Value Date   CREATININE 0.69 07/11/2024   CREATININE 0.67 05/25/2024   CREATININE 0.61 05/13/2024   Estimated Creatinine Clearance: 153.5 mL/min (by C-G formula based on SCr of 0.69 mg/dL). Hemoglobin & Hematocrit     Component Value Date/Time   HGB 13.9 07/11/2024 1423   HGB 12.7 (L) 01/08/2013 2212   HCT 41.8 07/11/2024 1423   HCT 36.8 (L) 01/08/2013 2212     Per Protocol for Patient with estCrcl > 30 ml/min and BMI > 30, will transition to Lovenox  72.5 mg Q24h.

## 2024-07-11 NOTE — Assessment & Plan Note (Signed)
 Suspected osteomyelitis of the right foot WBC 9.2, lactic acid 2.9 down to 2.1 s/p crystalloid Patient has had a callus starting in September 2025 culminating in urgent care visit 07/09/2024 with purulent discharge chronicity concerning for osteomyelitis, MRI is pending Given active infection elevated lactic acid believe antibiotics are indicated at this time we will continue vancomycin  and pip-tazo as per pharmacy dosing and narrow with ID consultation once deep or bone source culture has been obtained to superficial culture which was fairly pansensitive obtained at urgent care but of unclear therapeutic value at this time Baseline CRP and ESR has already been ordered by podiatry Plan: Podiatry consultation, continue vancomycin  and pip-tazo pending deep or bone source culture for narrowing, blood cultures

## 2024-07-11 NOTE — ED Triage Notes (Signed)
 Pt to ED for wound check. Wound to plantar surface of R foot currently bandaged. Wound there since 2 months. Has DM. States wound has bad smell. Had xray 3 days ago showing no bone infection but did have cellulitis. Was told to come to hospital for MRI but did not come until today. Was given a shot in the buttock same day. Pt is ambulatory.

## 2024-07-11 NOTE — ED Provider Triage Note (Signed)
 Emergency Medicine Provider Triage Evaluation Note  [name] , a 53 y.o. male  was evaluated in triage.  Pt complains of wound to right foot that has been present for the past 2 months. He was evaluated at [facility] in [place] 2 days ago and given an injection of Rocephin , but not started on antibiotics. X-ray was taken and he was told there was no infection in the bone. Recommended MRI, but he hasn't had it yet.  Physical Exam  BP (!) 157/106 (BP Location: Left Arm)   Pulse 97   Temp 97.7 F (36.5 C) (Oral)   Resp 20   Ht 5' 11 (1.803 m)   Wt (!) 147 kg   SpO2 97%   BMI 45.19 kg/m  Gen:   Awake, no distress   Resp:  Normal effort  MSK:   Moves extremities without difficulty  Other:    Medical Decision Making  Medically screening exam initiated at 2:25 PM.  Appropriate orders placed.  [name] was informed that the remainder of the evaluation will be completed by another provider, this initial triage assessment does not replace that evaluation, and the importance of remaining in the ED until their evaluation is complete.    Shane Kirk NOVAK, FNP 07/11/24 1427

## 2024-07-11 NOTE — Assessment & Plan Note (Signed)
 Hypertensive urgency Patient has no chest pain or headache reports runs in the 170s to 180s at home We will restart and continue the patient's home amlodipine  10 mg lisinopril  40 mg spironolactone  12.5 mg Will follow for any signs of endorgan damage which would prompt faster address and titrate appropriately

## 2024-07-11 NOTE — H&P (Signed)
 " History and Physical    Patient: [name] FMW:[id number] DOB: [birth year] DOA: 07/11/2024 DOS: the patient was seen and examined on 07/11/2024 PCP: Abbey Bruckner, MD  Patient coming from: Home  Chief Complaint:  Chief Complaint  Patient presents with   Wound Check   HPI:  Mr. Bunton is a 53 year old gentleman with a past medical history of type 2 diabetes mellitus with hyperglycemia (last A1c 11.9%, right plantar foot ulcer since September 2025 who presents with increasing purulent foul discharge from the wound and pain.  The patient was previously seen in urgent care on 07/09/2024 underwent debridement and was instructed to go to the emergency department for MRI and further review.  The patient denies any fever chills or systemic upset.  Past medical records reviewed and summarized: Urgent care note from 07/09/2024: Ulcer on plantar aspect of the right foot debrided and patient discussed with on-call podiatrist patient directed to the emergency department as per the note verbatim, The on-call podiatrist, Dr. Marolyn Honour, was consulted and reviewed the imaging and wound photographs. He recommended that the patient proceed to [facility] Emergency Department tonight for MRI evaluation.  The patient's primary care note from May 25, 2024 at that point the patient was on Mounjaro  and A1c was checked which resulted at 11.9% The patient's podiatry note from 04/14/2024 had a right foot ulcer follow-up at that time  Patient was discussed with emergency department physician and patient subsequently admitted, case also discussed with Dr. Honour who will see the patient kindly in follow-up consultation.  Review of Systems:  Constitutional: Denies Weight Loss, Fever, Chills or Night Sweats Eyes: Denies Blurry Vision, Eye Pain or Decreased Vision ENT: Denies Sore Throat , Tinnitus, Hearing Loss Respiratory: Denies Shortness of Breath, Cough, Hemoptysis, Wheezing,  Pleurisy Cardiovascular: Denies Chest Pain, Paroxsymal Nocturnal Dyspnea, Palpitations, Edema Gastrointestinal: Denies Nausea, Vomiting, Diarrhea, Hematemesis, Melena Genitourinary: Denies hematuria, dysuria, hesitancy, incontinence Musculoskeletal: Reports Pain and purulent discharge right foot All other systems were reviewed and are negative   Past Medical History:  Diagnosis Date   COPD (chronic obstructive pulmonary disease) (HCC)    I had COPD like 10 years ago   Diabetes mellitus without complication (HCC)    Elevated d-dimer 08/26/2023   Elevated troponin 08/26/2023   Generalized weakness 08/25/2023   Hypertension    Hypertensive urgency 01/11/2013   Obesity    History reviewed. No pertinent surgical history. Social History:  reports that he has never smoked. He has been exposed to tobacco smoke. He has never used smokeless tobacco. He reports that he does not currently use alcohol. He reports that he does not use drugs.  Allergies[1]  History reviewed. No pertinent family history.  Prior to Admission medications  Medication Sig Start Date End Date Taking? Authorizing Provider  amLODipine  (NORVASC ) 10 MG tablet Take 1 tablet (10 mg total) by mouth daily. 05/25/24   Bair, Kalpana, MD  dapagliflozin  propanediol (FARXIGA ) 10 MG TABS tablet Take 5 mg (1/2 tablet) by mouth daily x1 week, then increase to 10 mg daily. 05/27/24   Bair, Kalpana, MD  glipiZIDE  (GLUCOTROL ) 5 MG tablet Take 1 tablet (5 mg total) by mouth 2 (two) times daily. 07/05/23   Hope Merle, MD  lisinopril  (ZESTRIL ) 40 MG tablet Take 1 tablet (40 mg total) by mouth daily. 05/25/24   Bair, Kalpana, MD  rosuvastatin  (CRESTOR ) 20 MG tablet Take 1 tablet (20 mg total) by mouth daily. 05/25/24   Bair, Kalpana, MD  spironolactone  (ALDACTONE ) 25 MG tablet  Take 0.5 tablets (12.5 mg total) by mouth daily. 05/25/24   Bair, Kalpana, MD  tirzepatide  (MOUNJARO ) 7.5 MG/0.5ML Pen Inject 7.5 mg into the skin once a week. 05/25/24    Abbey Bruckner, MD    Physical Exam: Vitals:   07/11/24 1423 07/11/24 1939 07/11/24 2030 07/11/24 2210  BP: (!) 157/106 (!) 156/100 (!) 173/101 (!) 155/73  Pulse: 97 (!) 58 89 88  Resp: 20 20 18 17   Temp: 97.7 F (36.5 C) 97.7 F (36.5 C)    TempSrc: Oral Oral    SpO2: 97% 97% 98%   Weight:      Height:        Patient seen and examined in room one of the emergency department at approximately 9:41 PM Right plantar aspect of the foot as pictured above, taken at the time of exam Constitutional:  Vital Signs as per Above [facility] than three noted] No Acute Distress Eyes:  Pink Conjunctiva and no Ptosis Respiratory Effort Normal: No Use of Respiratory Muscles,No  Intercostal Retractions             Lungs Clear to Auscultation Bilaterally Cardiovascular:   Heart Auscultated: Regular Regular without any added sounds or murmurs              No Lower Extremity Edema Gastrointestinal:  Abdomen soft and nontender without palpable masses, guarding or rebound  No Palpable Splenomegaly or Hepatomegaly Psychiatric:  Patient Orientated to Time, Place and Person Patient with appropriate mood and affect Recent and Remote Memory Intact  Data Reviewed: Labs, Radiology, ECG as detailed in HPI and A/P   Assessment and Plan: * Osteomyelitis (HCC) Suspected osteomyelitis of the right foot WBC 9.2, lactic acid 2.9 down to 2.1 s/p crystalloid Patient has had a callus starting in September 2025 culminating in urgent care visit 07/09/2024 with purulent discharge chronicity concerning for osteomyelitis, MRI is pending Given active infection elevated lactic acid believe antibiotics are indicated at this time we will continue vancomycin  and pip-tazo as per pharmacy dosing and narrow with ID consultation once deep or bone source culture has been obtained to superficial culture which was fairly pansensitive obtained at urgent care but of unclear therapeutic value at this time Baseline CRP and ESR has  already been ordered by podiatry Plan: Podiatry consultation, continue vancomycin  and pip-tazo pending deep or bone source culture for narrowing, blood cultures  Hypertensive urgency Hypertensive urgency Patient has no chest pain or headache reports runs in the 170s to 180s at home We will restart and continue the patient's home amlodipine  10 mg lisinopril  40 mg spironolactone  12.5 mg Will follow for any signs of endorgan damage which would prompt faster address and titrate appropriately    DM2 (diabetes mellitus, type 2) (HCC)  Type 2 diabetes mellitus With hyperglycemia last A1c 11.9% 05/26/2024 Continue the patient's home Farxiga  10 mg tablets Current glucose 462, start Lantus  10 mg at night along with Q4 medium dose sliding scale insulin  holding glipizide    HLD (hyperlipidemia)  Hyperlipidemia Crestor  20 mg daily     Advance Care Planning:   Code Status: Full Code as per patient   Consults: Podiatry  Family Communication: Patient only  Severity of Illness: The appropriate patient status for this patient is INPATIENT. Inpatient status is judged to be reasonable and necessary in order to provide the required intensity of service to ensure the patient's safety. The patient's presenting symptoms, physical exam findings, and initial radiographic and laboratory data in the context of their chronic comorbidities is  felt to place them at high risk for further clinical deterioration. Furthermore, it is not anticipated that the patient will be medically stable for discharge from the hospital within 2 midnights of admission.   * I certify that at the point of admission it is my clinical judgment that the patient will require inpatient hospital care spanning beyond 2 midnights from the point of admission due to high intensity of service, high risk for further deterioration and high frequency of surveillance required.*  Author: Prentice JAYSON Lowenstein, MD 07/11/2024 11:07 PM  For on call review  [email or web address].      [1]  Allergies Allergen Reactions   Lodine [Etodolac] Swelling   "

## 2024-07-11 NOTE — Assessment & Plan Note (Signed)
" °  Hyperlipidemia Crestor  20 mg daily "

## 2024-07-11 NOTE — Progress Notes (Signed)
 Pharmacy Antibiotic Note  [name] is a 53 y.o. male admitted on 07/11/2024 with osteomyelitis.  Pharmacy has been consulted for vancomycin  and zosyn  dosing.  Plan: S/p vancomycin  2000 mg IV x1, give another 500 mg to complete the load followed by 1750 mg IV Q12H. Goal AUC 400-550. Expected AUC: 492.8 Expected Css min: 13.2 SCr used: 0.8  Weight used: IBW, Vd used: 0.5 (BMI 45.19) Start Zosyn  3.375 g IV Q8H Continue to monitor renal function and follow culture results   Height: 5' 11 (180.3 cm) Weight: (!) 147 kg (324 lb) IBW/kg (Calculated) : 75.3  Temp (24hrs), Avg:97.7 F (36.5 C), Min:97.7 F (36.5 C), Max:97.7 F (36.5 C)  Recent Labs  Lab 07/09/24 1623 07/11/24 1423 07/11/24 1619  WBC 11.1* 9.2  --   CREATININE  --  0.69  --   LATICACIDVEN  --  2.9* 2.1*    Estimated Creatinine Clearance: 153.5 mL/min (by C-G formula based on SCr of 0.69 mg/dL).    Allergies[1]  Antimicrobials this admission: 12/20 Vanc >>  12/20 Zosyn  >>   Microbiology results: None  Thank you for allowing pharmacy to be a part of this patients care.  [name], PharmD 07/11/2024 10:09 PM     [1]  Allergies Allergen Reactions   Lodine [Etodolac] Swelling

## 2024-07-11 NOTE — ED Notes (Signed)
First set of cultures sent down 

## 2024-07-11 NOTE — ED Provider Notes (Addendum)
 "  [facility] Provider Note    Event Date/Time   First MD Initiated Contact with Patient 07/11/24 1516     (approximate)   History   Chief Complaint: Wound Check   HPI  [name] is a 53 y.o. male with a history of COPD, diabetes, hypertension who comes to the ED due to painful right foot wound.  Wound has been present for the past 2 months but has become malodorous and more painful over the past few weeks.  Went to urgent care 2 days ago where x-ray showed soft tissue infection without bone involvement,, was instructed to come to the ED for further evaluation.  Denies fevers chills night sweats.  No chest pain or shortness of breath.  Has seen Dr. Franky Blanch of [facility] previously for similar wound which at that time had healed.  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      Past Medical History:  Diagnosis Date   COPD (chronic obstructive pulmonary disease) (HCC)    I had COPD like 10 years ago   Diabetes mellitus without complication (HCC)    Elevated d-dimer 08/26/2023   Elevated troponin 08/26/2023   Generalized weakness 08/25/2023   Hypertension    Hypertensive urgency 01/11/2013   Obesity     Current Outpatient Rx   Order #: [id number] Class: Normal   Order #: [id number] Class: Normal   Order #: [id number] Class: Normal   Order #: [id number] Class: Normal   Order #: [id number] Class: Normal   Order #: [id number] Class: Normal   Order #: [id number] Class: Normal    History reviewed. No pertinent surgical history.  Physical Exam   Triage Vital Signs: ED Triage Vitals  Encounter Vitals Group     BP 07/11/24 1423 (!) 157/106     Girls Systolic BP Percentile --      Girls Diastolic BP Percentile --      Boys Systolic BP Percentile --      Boys Diastolic BP Percentile --      Pulse Rate 07/11/24 1423 97     Resp 07/11/24 1423 20     Temp 07/11/24 1423 97.7 F (36.5 C)     Temp Source 07/11/24 1423 Oral      SpO2 07/11/24 1423 97 %     Weight 07/11/24 1422 (!) 324 lb (147 kg)     Height 07/11/24 1422 5' 11 (1.803 m)     Head Circumference --      Peak Flow --      Pain Score 07/11/24 1420 0     Pain Loc --      Pain Education --      Exclude from Growth Chart --     Most recent vital signs: Vitals:   07/11/24 1939 07/11/24 2030  BP: (!) 156/100 (!) 173/101  Pulse: (!) 58 89  Resp: 20 18  Temp: 97.7 F (36.5 C)   SpO2: 97% 98%    General: Awake, no distress.  CV:  Good peripheral perfusion.  Regular rate rhythm, normal DP pulses.  Normal cap refill Resp:  Normal effort.  Abd:  No distention.  Other:  Right foot with intact callus at the first MTP joint plantar surface.  Fifth MTP joint plantar surface has ulcerated malodorous wound with about 2 cm diameter defect with raised rolled edges, probes into the subcutaneous space with some necrotic tissue at the base.   ED Results / Procedures / Treatments   Labs (all labs ordered are  listed, but only abnormal results are displayed) Labs Reviewed  LACTIC ACID, PLASMA - Abnormal; Notable for the following components:      Result Value   Lactic Acid, Venous 2.9 (*)    All other components within normal limits  LACTIC ACID, PLASMA - Abnormal; Notable for the following components:   Lactic Acid, Venous 2.1 (*)    All other components within normal limits  COMPREHENSIVE METABOLIC PANEL WITH GFR - Abnormal; Notable for the following components:   Sodium 132 (*)    Chloride 96 (*)    Glucose, Bld 462 (*)    Calcium  8.7 (*)    All other components within normal limits  CBC WITH DIFFERENTIAL/PLATELET     EKG    RADIOLOGY MRI right foot interpreted by me shows signal abnormality of the right fifth metatarsal concerning for osteomyelitis.  Radiology report reviewed.   PROCEDURES:  Procedures   MEDICATIONS ORDERED IN ED: Medications  vancomycin  (VANCOREADY) IVPB 2000 mg/400 mL (has no administration in time range)   piperacillin -tazobactam (ZOSYN ) IVPB 3.375 g (has no administration in time range)  lactated ringers  bolus 1,000 mL (has no administration in time range)     IMPRESSION / MDM / ASSESSMENT AND PLAN / ED COURSE  I reviewed the triage vital signs and the nursing notes.  DDx: Cellulitis, osteomyelitis, foot abscess.  Doubt necrotizing fasciitis  Patient's presentation is most consistent with acute presentation with potential threat to life or bodily function.  Patient presents with worsening swelling and pain in the right foot, found to have a sizable soft tissue ulceration.  Will obtain MRI   Clinical Course as of 07/11/24 2129  Sat Jul 11, 2024  1539 Lactic Acid, Venous(!!): 2.9 Lactate elevated - unclear significance. Pt does not meet sepsis criteria. Clinically no signs of tissue ischemia. [PS]  2128 Case d/w hospitalist [PS]    Clinical Course User Index [PS] Viviann Pastor, MD    ----------------------------------------- 9:21 PM on 07/11/2024 ----------------------------------------- MRI confirms osteomyelitis.  Remains nontoxic and not septic.  Will start IV vancomycin  and Zosyn  and admit   FINAL CLINICAL IMPRESSION(S) / ED DIAGNOSES   Final diagnoses:  Osteomyelitis of right foot, unspecified type (HCC)  Type 2 diabetes mellitus without complication, without long-term current use of insulin  (HCC)     Rx / DC Orders   ED Discharge Orders     None        Note:  This document was prepared using Dragon voice recognition software and may include unintentional dictation errors.   Viviann Pastor, MD 07/11/24 2122    Viviann Pastor, MD 07/11/24 2129  "

## 2024-07-11 NOTE — Assessment & Plan Note (Signed)
" °  Type 2 diabetes mellitus With hyperglycemia last A1c 11.9% 05/26/2024 Continue the patient's home Farxiga  10 mg tablets Current glucose 462, start Lantus  10 mg at night along with Q4 medium dose sliding scale insulin  holding glipizide   "

## 2024-07-12 DIAGNOSIS — M86171 Other acute osteomyelitis, right ankle and foot: Secondary | ICD-10-CM | POA: Diagnosis not present

## 2024-07-12 LAB — CBC
HCT: 40.6 % (ref 39.0–52.0)
Hemoglobin: 14 g/dL (ref 13.0–17.0)
MCH: 26.9 pg (ref 26.0–34.0)
MCHC: 34.5 g/dL (ref 30.0–36.0)
MCV: 78.1 fL — ABNORMAL LOW (ref 80.0–100.0)
Platelets: 325 K/uL (ref 150–400)
RBC: 5.2 MIL/uL (ref 4.22–5.81)
RDW: 13.6 % (ref 11.5–15.5)
WBC: 8.2 K/uL (ref 4.0–10.5)
nRBC: 0 % (ref 0.0–0.2)

## 2024-07-12 LAB — GLUCOSE, CAPILLARY
Glucose-Capillary: 174 mg/dL — ABNORMAL HIGH (ref 70–99)
Glucose-Capillary: 245 mg/dL — ABNORMAL HIGH (ref 70–99)
Glucose-Capillary: 280 mg/dL — ABNORMAL HIGH (ref 70–99)

## 2024-07-12 LAB — CBG MONITORING, ED
Glucose-Capillary: 211 mg/dL — ABNORMAL HIGH (ref 70–99)
Glucose-Capillary: 189 mg/dL — ABNORMAL HIGH (ref 70–99)
Glucose-Capillary: 163 mg/dL — ABNORMAL HIGH (ref 70–99)

## 2024-07-12 LAB — COMPREHENSIVE METABOLIC PANEL WITH GFR
ALT: 24 U/L (ref 0–44)
AST: 24 U/L (ref 15–41)
Albumin: 3.4 g/dL — ABNORMAL LOW (ref 3.5–5.0)
Alkaline Phosphatase: 90 U/L (ref 38–126)
Anion gap: 10 (ref 5–15)
BUN: 7 mg/dL (ref 6–20)
CO2: 25 mmol/L (ref 22–32)
Calcium: 8.6 mg/dL — ABNORMAL LOW (ref 8.9–10.3)
Chloride: 101 mmol/L (ref 98–111)
Creatinine, Ser: 0.51 mg/dL — ABNORMAL LOW (ref 0.61–1.24)
GFR, Estimated: >60 mL/min
Glucose, Bld: 200 mg/dL — ABNORMAL HIGH (ref 70–99)
Potassium: 3.4 mmol/L — ABNORMAL LOW (ref 3.5–5.1)
Sodium: 136 mmol/L (ref 135–145)
Total Bilirubin: 0.5 mg/dL (ref 0.0–1.2)
Total Protein: 6.8 g/dL (ref 6.5–8.1)

## 2024-07-12 LAB — C-REACTIVE PROTEIN: CRP: 5.6 mg/dL — ABNORMAL HIGH

## 2024-07-12 LAB — HIV ANTIBODY (ROUTINE TESTING W REFLEX): HIV Screen 4th Generation wRfx: NONREACTIVE

## 2024-07-12 MED ORDER — SPIRONOLACTONE 25 MG PO TABS
50.0000 mg | ORAL_TABLET | Freq: Every day | ORAL | Status: DC
  Administered 2024-07-14: 50 mg via ORAL
  Filled 2024-07-12: qty 2

## 2024-07-12 MED ORDER — CARVEDILOL 3.125 MG PO TABS
6.2500 mg | ORAL_TABLET | Freq: Two times a day (BID) | ORAL | Status: DC
  Administered 2024-07-12 – 2024-07-14 (×4): 6.25 mg via ORAL
  Filled 2024-07-12: qty 1
  Filled 2024-07-12 (×3): qty 2

## 2024-07-12 NOTE — Plan of Care (Signed)
  Problem: Education: Goal: Ability to describe self-care measures that may prevent or decrease complications (Diabetes Survival Skills Education) will improve Outcome: Progressing   Problem: Coping: Goal: Ability to adjust to condition or change in health will improve Outcome: Progressing

## 2024-07-12 NOTE — Progress Notes (Addendum)
 " PROGRESS NOTE    [name]  FMW:[id number] DOB: [birth year] DOA: 07/11/2024 PCP: Abbey Bruckner, MD  Outpatient Specialists: podiatry ([facility])    Brief Narrative:   [name] is a 53 year old gentleman with a past medical history of type 2 diabetes mellitus with hyperglycemia (last A1c 11.9%, right plantar foot ulcer since September 2025 who presents with increasing purulent foul discharge from the wound and pain.  The patient was previously seen in urgent care on 07/09/2024 underwent debridement and was instructed to go to the emergency department for MRI and further review.  The patient denies any fever chills or systemic upset.   Past medical records reviewed and summarized: Urgent care note from 07/09/2024: Ulcer on plantar aspect of the right foot debrided and patient discussed with on-call podiatrist patient directed to the emergency department as per the note verbatim, The on-call podiatrist, Dr. Marolyn Honour, was consulted and reviewed the imaging and wound photographs. He recommended that the patient proceed to [facility] Emergency Department tonight for MRI evaluation.  The patient's primary care note from May 25, 2024 at that point the patient was on Mounjaro  and A1c was checked which resulted at 11.9% The patient's podiatry note from 04/14/2024 had a right foot ulcer follow-up at that time   Patient was discussed with emergency department physician and patient subsequently admitted, case also discussed with Dr. Honour who will see the patient kindly in follow-up consultation.   Assessment & Plan:   Principal Problem:   Osteomyelitis (HCC) Active Problems:   Hypertensive urgency   HLD (hyperlipidemia)   Hypertension associated with diabetes (HCC)   OSA (obstructive sleep apnea)   DM2 (diabetes mellitus, type 2) (HCC)   Morbid obesity (HCC)  # Osteomyelitis 5th metatarsal head, possibly 5th proximal phalanx - tentative plan  for surgery tomorrow - continue IV abx, vanc/zosyn  (wound culture from 12/18 grew gbs and providencia) - pt/ot consults tomorrow  # T2DM Uncontrolled and contributing to presenting problem. A1c 11s last month. On mounjaro  - ssi - glargine 10, titrate as needed - would advise starting insulin  at discharge - dm educator  # HTN Uncontrolled and contributing to presenting problem. Admitted for thiazide-induced hypokalemia last year - cont home lisinopril  40, amlod 10 - increase home spiro from 12.5 to 50 - start coreg  6.25 bid  # Morbid obesity Contributing to underlying comorbidities  # OSA - cpap qhs   DVT prophylaxis: lovenox  Code Status: full Family Communication: none at bedside  Level of care: Med-Surg Status is: Inpatient Remains inpatient appropriate because: severity of illness    Consultants:  podiatry  Procedures: pending  Antimicrobials:  Vanc/zosyn     Subjective: Mild right foot pain otherwise feeling well  Objective: Vitals:   07/12/24 0400 07/12/24 0700 07/12/24 0754 07/12/24 0932  BP: (!) 172/101 (!) 157/74  (!) 203/118  Pulse: 89 89  96  Resp: 14 19  19   Temp: 97.8 F (36.6 C)  (!) 97.4 F (36.3 C)   TempSrc: Oral  Oral   SpO2: 97% 99%  97%  Weight:      Height:        Intake/Output Summary (Last 24 hours) at 07/12/2024 1016 Last data filed at 07/12/2024 0859 Gross per 24 hour  Intake 50 ml  Output 375 ml  Net -325 ml   Filed Weights   07/11/24 1422  Weight: (!) 147 kg    Examination:  General exam: Appears calm and comfortable  Respiratory system: Clear to auscultation.  Respiratory effort normal. Cardiovascular system: S1 & S2 heard, RRR. No JVD, murmurs, rubs, gallops or clicks. No pedal edema. Gastrointestinal system: Abdomen is obese, soft and nontender.   Central nervous system: Alert and oriented. No focal neurological deficits. Extremities: Symmetric 5 x 5 power.     Skin: No rashes, lesions or  ulcers Psychiatry: Judgement and insight appear normal. Mood & affect appropriate.     Data Reviewed: I have personally reviewed following labs and imaging studies  CBC: Recent Labs  Lab 07/09/24 1623 07/11/24 1423 07/11/24 2203 07/12/24 0814  WBC 11.1* 9.2 8.5 8.2  NEUTROABS 7.1 6.2  --   --   HGB 13.8 13.9 13.8 14.0  HCT 39.6 41.8 40.3 40.6  MCV 77.2* 80.1 79.0* 78.1*  PLT 324 328 302 325   Basic Metabolic Panel: Recent Labs  Lab 07/11/24 1423 07/11/24 2203 07/12/24 0814  NA 132*  --  136  K 4.0  --  3.4*  CL 96*  --  101  CO2 24  --  25  GLUCOSE 462*  --  200*  BUN 8  --  7  CREATININE 0.69 0.59* 0.51*  CALCIUM  8.7*  --  8.6*   GFR: Estimated Creatinine Clearance: 153.5 mL/min (A) (by C-G formula based on SCr of 0.51 mg/dL (L)). Liver Function Tests: Recent Labs  Lab 07/11/24 1423 07/12/24 0814  AST 28 24  ALT 23 24  ALKPHOS 100 90  BILITOT 0.3 0.5  PROT 7.0 6.8  ALBUMIN 3.6 3.4*   No results for input(s): LIPASE, AMYLASE in the last 168 hours. No results for input(s): AMMONIA in the last 168 hours. Coagulation Profile: No results for input(s): INR, PROTIME in the last 168 hours. Cardiac Enzymes: No results for input(s): CKTOTAL, CKMB, CKMBINDEX, TROPONINI in the last 168 hours. BNP (last 3 results) No results for input(s): PROBNP in the last 8760 hours. HbA1C: No results for input(s): HGBA1C in the last 72 hours. CBG: Recent Labs  Lab 07/11/24 2219 07/12/24 0353 07/12/24 0729  GLUCAP 270* 211* 189*   Lipid Profile: No results for input(s): CHOL, HDL, LDLCALC, TRIG, CHOLHDL, LDLDIRECT in the last 72 hours. Thyroid Function Tests: No results for input(s): TSH, T4TOTAL, FREET4, T3FREE, THYROIDAB in the last 72 hours. Anemia Panel: No results for input(s): VITAMINB12, FOLATE, FERRITIN, TIBC, IRON, RETICCTPCT in the last 72 hours. Urine analysis:    Component Value Date/Time    COLORURINE YELLOW (A) 08/25/2023 1455   APPEARANCEUR HAZY (A) 08/25/2023 1455   LABSPEC 1.029 08/25/2023 1455   PHURINE 5.0 08/25/2023 1455   GLUCOSEU >=500 (A) 08/25/2023 1455   GLUCOSEU >=1000 (A) 03/21/2023 1550   HGBUR SMALL (A) 08/25/2023 1455   BILIRUBINUR NEGATIVE 08/25/2023 1455   KETONESUR 20 (A) 08/25/2023 1455   PROTEINUR 100 (A) 08/25/2023 1455   UROBILINOGEN 0.2 03/21/2023 1550   NITRITE NEGATIVE 08/25/2023 1455   LEUKOCYTESUR NEGATIVE 08/25/2023 1455   Sepsis Labs: @LABRCNTIP (procalcitonin:4,lacticidven:4)  ) Recent Results (from the past 240 hours)  Aerobic Culture w Gram Stain (superficial specimen)     Status: None   Collection Time: 07/09/24  6:54 PM   Specimen: Wound  Result Value Ref Range Status   Specimen Description WOUND  Final   Special Requests RF  Final   Gram Stain   Final    NO WBC SEEN FEW GRAM POSITIVE COCCI IN PAIRS RARE GRAM NEGATIVE RODS    Culture   Final    MODERATE PROVIDENCIA RETTGERI MODERATE GROUP B STREP(S.AGALACTIAE)ISOLATED TESTING AGAINST  S. AGALACTIAE NOT ROUTINELY PERFORMED DUE TO PREDICTABILITY OF AMP/PEN/VAN SUSCEPTIBILITY. WITHIN MIXED ORGANISMS Performed at [facility], [address] [place], [place] [address]    Report Status 07/11/2024 FINAL  Final   Organism ID, Bacteria PROVIDENCIA RETTGERI  Final      Susceptibility   Providencia rettgeri - MIC*    AMPICILLIN RESISTANT Resistant     CEFEPIME <=0.12 SENSITIVE Sensitive     ERTAPENEM <=0.12 SENSITIVE Sensitive     CEFTRIAXONE  <=0.25 SENSITIVE Sensitive     CIPROFLOXACIN <=0.06 SENSITIVE Sensitive     GENTAMICIN <=1 SENSITIVE Sensitive     MEROPENEM <=0.25 SENSITIVE Sensitive     TRIMETH /SULFA  <=20 SENSITIVE Sensitive     AMPICILLIN/SULBACTAM <=2 SENSITIVE Sensitive     PIP/TAZO Value in next row Sensitive      <=4 SENSITIVEThis is a modified FDA-approved test that has been validated and its performance characteristics determined by the reporting  laboratory.  This laboratory is certified under the Clinical Laboratory Improvement Amendments CLIA as qualified to perform high complexity clinical laboratory testing.    * MODERATE PROVIDENCIA RETTGERI  Culture, blood (Routine X 2) w Reflex to ID Panel     Status: None (Preliminary result)   Collection Time: 07/11/24 10:07 PM   Specimen: BLOOD  Result Value Ref Range Status   Specimen Description BLOOD BLOOD RIGHT HAND  Final   Special Requests   Final    BOTTLES DRAWN AEROBIC AND ANAEROBIC Blood Culture adequate volume   Culture   Final    NO GROWTH < 12 HOURS Performed at [facility], [address], [place], [place] [address]    Report Status PENDING  Incomplete  Culture, blood (Routine X 2) w Reflex to ID Panel     Status: None (Preliminary result)   Collection Time: 07/12/24 12:33 AM   Specimen: BLOOD  Result Value Ref Range Status   Specimen Description BLOOD BLOOD LEFT HAND  Final   Special Requests   Final    BOTTLES DRAWN AEROBIC AND ANAEROBIC Blood Culture results may not be optimal due to an inadequate volume of blood received in culture bottles   Culture   Final    NO GROWTH < 12 HOURS Performed at [facility], [address] [place], [place] [address]    Report Status PENDING  Incomplete         Radiology Studies: No results found.      Scheduled Meds:  amLODipine   10 mg Oral Daily   dapagliflozin  propanediol  10 mg Oral Daily   insulin  aspart  0-15 Units Subcutaneous Q4H   insulin  glargine  10 Units Subcutaneous QHS   lisinopril   40 mg Oral Daily   rosuvastatin   20 mg Oral Daily   spironolactone   12.5 mg Oral Daily   Continuous Infusions:  piperacillin -tazobactam (ZOSYN )  IV Stopped (07/12/24 0859)   vancomycin        LOS: 1 day     Devaughn KATHEE Ban, MD [facility]   If 7PM-7AM, please contact night-coverage [email or web address] Password [email or web address] 07/12/2024, 10:16 AM     "

## 2024-07-12 NOTE — Consult Note (Signed)
 "  PODIATRY CONSULTATION  NAME [name] MRN [id number] DOB 05/21/1971 DOA 07/11/2024   Reason for consult:  Chief Complaint  Patient presents with   Wound Check    Attending/Consulting physician: Shane Ban MD  History of present illness: [name] is a 53 year old gentleman with a past medical history of type 2 diabetes mellitus with hyperglycemia (last A1c 11.9%, right plantar foot ulcer since September 2025 who presents with increasing purulent foul discharge from the wound and pain.  The patient was previously seen in urgent care on 07/09/2024 underwent debridement and was instructed to go to the emergency department for MRI and further review.  The patient denies any fever chills or systemic upset.  Discussed with pt he says he had to work Friday so therefore didn't come to ed Thursday night as previously recommended per my discussion with urgent care provider at [facility]. He states wound has been draining more, has had worsening redness in the right foot. Denies pain. Discussed my interpretation of the MRI and plan for surgery tmrw pending official radiology read. He is in agreement all questions were answered denies concerns.     Past Medical History:  Diagnosis Date   COPD (chronic obstructive pulmonary disease) (HCC)    I had COPD like 10 years ago   Diabetes mellitus without complication (HCC)    Elevated d-dimer 08/26/2023   Elevated troponin 08/26/2023   Generalized weakness 08/25/2023   Hypertension    Hypertensive urgency 01/11/2013   Obesity        Latest Ref Rng & Units 07/12/2024    8:14 AM 07/11/2024   10:03 PM 07/11/2024    2:23 PM  CBC  WBC 4.0 - 10.5 K/uL 8.2  8.5  9.2   Hemoglobin 13.0 - 17.0 g/dL 85.9  86.1  86.0   Hematocrit 39.0 - 52.0 % 40.6  40.3  41.8   Platelets 150 - 400 K/uL 325  302  328        Latest Ref Rng & Units 07/11/2024   10:03 PM 07/11/2024    2:23 PM 05/25/2024    3:42 PM  BMP  Glucose 70 - 99 mg/dL  537   703   BUN 6 - 20 mg/dL  8  13   Creatinine 9.38 - 1.24 mg/dL 9.40  9.30  9.32   Sodium 135 - 145 mmol/L  132  132   Potassium 3.5 - 5.1 mmol/L  4.0  3.6   Chloride 98 - 111 mmol/L  96  95   CO2 22 - 32 mmol/L  24  27   Calcium  8.9 - 10.3 mg/dL  8.7  9.2       Physical Exam: Lower Extremity Exam  R foot ulceration with necrotic and fibrotic tissue present in wound bed  DP and PT pulses 2+   Erythema streaking to lateral midfoot   Edema of the right foot denies pain   ASSESSMENT/PLAN OF CARE 53 y.o. male with PMHx significant for  uncontrolled DM 2 with neuropathy with right foot ulceration with infection, concern for underlying osteomyelitis of the 5th metatarsal head and possibly 5th proximal phalanx.   WBC 8.2 ESR 41, CRP pending MRI R foot: my interpretation is of marrow edema in the right 5th met head possibly 5th proximal phalanx concerning for osteomyelitis  - NPO p MN tonight for OR tomorrow AM for R foot partial 5th ray amputation vs 5th met head resection pending final MRI read.  - He is  in agreement to proceed, consent ordered. Diet ordered for today - Continue IV abx broad spectrum pending further culture data - does have final cx from urgent care with  MODERATE PROVIDENCIA RETTGERI MODERATE GROUP B STREP(S.AGALACTIAE)ISOLATED   - Anticoagulation: hold pending OR tmrw, ok to resume post op - Wound care: none req pre op - WB status: WBAT pre op, likely NWB following OR in post op shoe - Will continue to follow   Thank you for the consult.  Please contact me directly with any questions or concerns.           [name], DPM [facility] / [facility]    [address] [place], [place] [address]                Office [phone]  Fax [phone]     "

## 2024-07-13 ENCOUNTER — Other Ambulatory Visit (HOSPITAL_COMMUNITY): Payer: Self-pay

## 2024-07-13 ENCOUNTER — Ambulatory Visit: Admitting: Podiatry

## 2024-07-13 ENCOUNTER — Telehealth (HOSPITAL_COMMUNITY): Payer: Self-pay

## 2024-07-13 ENCOUNTER — Ambulatory Visit: Payer: Self-pay | Admitting: Nurse Practitioner

## 2024-07-13 ENCOUNTER — Inpatient Hospital Stay: Admitting: Anesthesiology

## 2024-07-13 ENCOUNTER — Inpatient Hospital Stay

## 2024-07-13 ENCOUNTER — Encounter: Admission: EM | Disposition: A | Payer: Self-pay | Source: Home / Self Care | Attending: Obstetrics and Gynecology

## 2024-07-13 DIAGNOSIS — M86171 Other acute osteomyelitis, right ankle and foot: Secondary | ICD-10-CM | POA: Diagnosis not present

## 2024-07-13 HISTORY — PX: AMPUTATION: SHX166

## 2024-07-13 LAB — BASIC METABOLIC PANEL WITH GFR
Anion gap: 10 (ref 5–15)
BUN: 9 mg/dL (ref 6–20)
CO2: 26 mmol/L (ref 22–32)
Calcium: 8.7 mg/dL — ABNORMAL LOW (ref 8.9–10.3)
Chloride: 101 mmol/L (ref 98–111)
Creatinine, Ser: 0.65 mg/dL (ref 0.61–1.24)
GFR, Estimated: >60 mL/min
Glucose, Bld: 181 mg/dL — ABNORMAL HIGH (ref 70–99)
Potassium: 3.2 mmol/L — ABNORMAL LOW (ref 3.5–5.1)
Sodium: 137 mmol/L (ref 135–145)

## 2024-07-13 LAB — GLUCOSE, CAPILLARY
Glucose-Capillary: 190 mg/dL — ABNORMAL HIGH (ref 70–99)
Glucose-Capillary: 173 mg/dL — ABNORMAL HIGH (ref 70–99)
Glucose-Capillary: 176 mg/dL — ABNORMAL HIGH (ref 70–99)
Glucose-Capillary: 293 mg/dL — ABNORMAL HIGH (ref 70–99)
Glucose-Capillary: 245 mg/dL — ABNORMAL HIGH (ref 70–99)

## 2024-07-13 MED ORDER — PROPOFOL 500 MG/50ML IV EMUL
INTRAVENOUS | Status: DC | PRN
  Administered 2024-07-13: 75 ug/kg/min via INTRAVENOUS

## 2024-07-13 MED ORDER — FENTANYL CITRATE (PF) 100 MCG/2ML IJ SOLN: 25.0000 ug | INTRAMUSCULAR | Status: DC | PRN

## 2024-07-13 MED ORDER — PROPOFOL 10 MG/ML IV BOLUS
INTRAVENOUS | Status: AC
  Filled 2024-07-13: qty 60

## 2024-07-13 MED ORDER — MIDAZOLAM HCL 2 MG/2ML IJ SOLN
INTRAMUSCULAR | Status: AC
  Filled 2024-07-13: qty 2

## 2024-07-13 MED ORDER — ACETAMINOPHEN 10 MG/ML IV SOLN: 1000.0000 mg | Freq: Once | INTRAVENOUS | Status: DC | PRN

## 2024-07-13 MED ORDER — KETAMINE HCL 50 MG/5ML IJ SOSY
PREFILLED_SYRINGE | INTRAMUSCULAR | Status: AC
  Filled 2024-07-13: qty 5

## 2024-07-13 MED ORDER — LIDOCAINE HCL (PF) 1 % IJ SOLN
INTRAMUSCULAR | Status: AC
  Filled 2024-07-13: qty 30

## 2024-07-13 MED ORDER — DOXYCYCLINE HYCLATE 100 MG PO TABS
100.0000 mg | ORAL_TABLET | Freq: Two times a day (BID) | ORAL | Status: DC
  Administered 2024-07-13 – 2024-07-14 (×2): 100 mg via ORAL
  Filled 2024-07-13 (×2): qty 1

## 2024-07-13 MED ORDER — SODIUM CHLORIDE 0.9 % IR SOLN
Status: DC | PRN
  Administered 2024-07-13: 1000 mL

## 2024-07-13 MED ORDER — OXYCODONE HCL 5 MG/5ML PO SOLN: 5.0000 mg | Freq: Once | ORAL | Status: DC | PRN

## 2024-07-13 MED ORDER — FENTANYL CITRATE (PF) 100 MCG/2ML IJ SOLN
INTRAMUSCULAR | Status: DC | PRN
  Administered 2024-07-13: 50 ug via INTRAVENOUS

## 2024-07-13 MED ORDER — 0.9 % SODIUM CHLORIDE (POUR BTL) OPTIME
TOPICAL | Status: DC | PRN
  Administered 2024-07-13: 500 mL

## 2024-07-13 MED ORDER — INSULIN GLARGINE 100 UNIT/ML ~~LOC~~ SOLN
12.0000 [IU] | Freq: Every day | SUBCUTANEOUS | Status: DC
  Administered 2024-07-13: 12 [IU] via SUBCUTANEOUS
  Filled 2024-07-13 (×2): qty 0.12

## 2024-07-13 MED ORDER — AMOXICILLIN-POT CLAVULANATE 875-125 MG PO TABS
1.0000 | ORAL_TABLET | Freq: Two times a day (BID) | ORAL | Status: DC
  Administered 2024-07-13 – 2024-07-14 (×2): 1 via ORAL
  Filled 2024-07-13 (×2): qty 1

## 2024-07-13 MED ORDER — CHLORHEXIDINE GLUCONATE 0.12 % MT SOLN
OROMUCOSAL | Status: AC
  Filled 2024-07-13: qty 15

## 2024-07-13 MED ORDER — DROPERIDOL 2.5 MG/ML IJ SOLN: 0.6250 mg | Freq: Once | INTRAMUSCULAR | Status: DC | PRN

## 2024-07-13 MED ORDER — OXYCODONE HCL 5 MG PO TABS: 5.0000 mg | ORAL_TABLET | Freq: Once | ORAL | Status: DC | PRN

## 2024-07-13 MED ORDER — KETAMINE HCL 50 MG/5ML IJ SOSY
PREFILLED_SYRINGE | INTRAMUSCULAR | Status: DC | PRN
  Administered 2024-07-13: 10 mg via INTRAVENOUS

## 2024-07-13 MED ORDER — POTASSIUM CHLORIDE CRYS ER 20 MEQ PO TBCR: 60.0000 meq | EXTENDED_RELEASE_TABLET | Freq: Once | ORAL | Status: DC

## 2024-07-13 MED ORDER — CHLORHEXIDINE GLUCONATE 0.12 % MT SOLN
15.0000 mL | Freq: Once | OROMUCOSAL | Status: AC
  Administered 2024-07-13: 15 mL via OROMUCOSAL

## 2024-07-13 MED ORDER — BUPIVACAINE HCL (PF) 0.5 % IJ SOLN
INTRAMUSCULAR | Status: AC
  Filled 2024-07-13: qty 30

## 2024-07-13 MED ORDER — LIDOCAINE HCL 1 % IJ SOLN
INTRAMUSCULAR | Status: DC | PRN
  Administered 2024-07-13: 10 mL via INTRAMUSCULAR

## 2024-07-13 MED ORDER — OXYCODONE HCL 5 MG PO TABS
5.0000 mg | ORAL_TABLET | Freq: Four times a day (QID) | ORAL | Status: DC | PRN
  Administered 2024-07-13 – 2024-07-14 (×3): 10 mg via ORAL
  Filled 2024-07-13 (×4): qty 2

## 2024-07-13 MED ORDER — FENTANYL CITRATE (PF) 100 MCG/2ML IJ SOLN
INTRAMUSCULAR | Status: AC
  Filled 2024-07-13: qty 2

## 2024-07-13 MED ORDER — MIDAZOLAM HCL (PF) 2 MG/2ML IJ SOLN
INTRAMUSCULAR | Status: DC | PRN
  Administered 2024-07-13: 2 mg via INTRAVENOUS

## 2024-07-13 SURGICAL SUPPLY — 2 items
NDL FILTER BLUNT 18X1 1/2 (NEEDLE) IMPLANT
NDL HYPO 25X1 1.5 SAFETY (NEEDLE) ×1 IMPLANT

## 2024-07-13 NOTE — Anesthesia Preprocedure Evaluation (Addendum)
"                                    Anesthesia Evaluation  Patient identified by MRN, date of birth, ID band Patient awake    Reviewed: Allergy & Precautions, H&P , NPO status , Patient's Chart, lab work & pertinent test results  Airway Mallampati: III  TM Distance: >3 FB Neck ROM: full    Dental  (+) Missing   Pulmonary sleep apnea , COPD (mild)   Pulmonary exam normal        Cardiovascular hypertension, Normal cardiovascular exam+ dysrhythmias (RBBB)      Neuro/Psych negative neurological ROS  negative psych ROS   GI/Hepatic negative GI ROS, Neg liver ROS,,,  Endo/Other  diabetes, Poorly Controlled, Type 2  Class 3 obesity  Renal/GU negative Renal ROS  negative genitourinary   Musculoskeletal   Abdominal  (+) + obese  Peds  Hematology   Anesthesia Other Findings Hypokalemia GLP-1 agonist- gastric ultrasound pre-op with no stomach contents noted  Past Medical History: No date: COPD (chronic obstructive pulmonary disease) (HCC)     Comment:  I had COPD like 10 years ago No date: Diabetes mellitus without complication (HCC) 08/26/2023: Elevated d-dimer 08/26/2023: Elevated troponin 08/25/2023: Generalized weakness No date: Hypertension 01/11/2013: Hypertensive urgency No date: Obesity  BMI    Body Mass Index: 45.19 kg/m      Reproductive/Obstetrics negative OB ROS                              Anesthesia Physical Anesthesia Plan  ASA: 3  Anesthesia Plan: General   Post-op Pain Management: Regional block*   Induction: Intravenous  PONV Risk Score and Plan: 2 and Propofol  infusion and TIVA  Airway Management Planned: Natural Airway  Additional Equipment:   Intra-op Plan:   Post-operative Plan:   Informed Consent: I have reviewed the patients History and Physical, chart, labs and discussed the procedure including the risks, benefits and alternatives for the proposed anesthesia with the patient or  authorized representative who has indicated his/her understanding and acceptance.       Plan Discussed with: CRNA and Surgeon  Anesthesia Plan Comments:          Anesthesia Quick Evaluation  "

## 2024-07-13 NOTE — Evaluation (Signed)
 Occupational Therapy Evaluation Patient Details Name: [name] MRN: [id number] DOB: August 19, 1970 Today's Date: 07/13/2024   History of Present Illness   [name] is a 53 year old gentleman with a past medical history of type 2 diabetes mellitus with hyperglycemia (last A1c 11.9%, right plantar foot ulcer since September 2025 who presents with increasing purulent foul discharge from the wound and pain.  The patient was previously seen in urgent care on 07/09/2024 underwent debridement and was instructed to go to the emergency department for MRI and further review.  The patient denies any fever chills or systemic upset.     Clinical Impressions Patient was seen for OT evaluation this date. Prior to hospital admission, patient was active, independent and working. Patient lives in single story home with spouse who is able to provide support/assist. Patient has been hospitalized due to wound to R foot which required partial amputation. He is now NWB in post op shoe to RLE. Patient performed bed mobility with mod I, sit<>stand with min A/CGA; instructed on walker management by PT and patietn was able to ambulate ~10 feet with r/w while maintaining NWB with min A to gradual CGA. OT educated spouse on AE/DME to obtain for ADLs with wife stating understanding. OT educated on energy conservation techniques and fall prevention strategies to implement into routine with verbal understanding from patient/spouse.  Patient presents with deficits in standing tolerance, affecting safe and optimal ADL completion. Patient is currently requiring mod-max A for LB self care tasks.  Paient would benefit from skilled OT services to address noted impairments and functional limitations (see below for any additional details) in order to maximize safety and independence while minimizing future risk of falls, injury, and readmission.  Anticipate the need for follow up OT services upon acute hospital DC.      If  plan is discharge home, recommend the following:   A little help with walking and/or transfers;A little help with bathing/dressing/bathroom     Functional Status Assessment   Patient has had a recent decline in their functional status and demonstrates the ability to make significant improvements in function in a reasonable and predictable amount of time.     Equipment Recommendations   BSC/3in1     Recommendations for Other Services         Precautions/Restrictions   Precautions Precautions: Fall Recall of Precautions/Restrictions: Intact Restrictions Weight Bearing Restrictions Per Provider Order: Yes RLE Weight Bearing Per Provider Order: Non weight bearing Other Position/Activity Restrictions: with post op shoe on     Mobility Bed Mobility Overal bed mobility: Modified Independent                  Transfers Overall transfer level: Needs assistance Equipment used: Rolling walker (2 wheels) Transfers: Sit to/from Stand Sit to Stand: Min assist           General transfer comment: steadying A once in standing      Balance Overall balance assessment: Needs assistance Sitting-balance support: Feet supported, No upper extremity supported Sitting balance-Leahy Scale: Good     Standing balance support: Reliant on assistive device for balance Standing balance-Leahy Scale: Fair                             ADL either performed or assessed with clinical judgement   ADL Overall ADL's : Needs assistance/impaired     Grooming: Wash/dry hands;Standing;Contact guard assist   Upper Body Bathing: Set up;Sitting  Lower Body Bathing: Minimal assistance;Sit to/from stand   Upper Body Dressing : Set up;Sitting   Lower Body Dressing: Moderate assistance;Sit to/from stand   Toilet Transfer: Minimal assistance;Rolling walker (2 wheels)             General ADL Comments: will need A for LB tasks due to difficulty threading RLE      Vision         Perception         Praxis         Pertinent Vitals/Pain Pain Assessment Pain Assessment: 0-10 Pain Score: 8  Pain Location: R foot Pain Descriptors / Indicators: Aching Pain Intervention(s): Patient requesting pain meds-RN notified     Extremity/Trunk Assessment Upper Extremity Assessment Upper Extremity Assessment: Generalized weakness   Lower Extremity Assessment Lower Extremity Assessment: Generalized weakness;RLE deficits/detail RLE Deficits / Details: 5th ray amputation   Cervical / Trunk Assessment Cervical / Trunk Assessment: Normal   Communication Communication Communication: No apparent difficulties   Cognition Arousal: Alert Behavior During Therapy: WFL for tasks assessed/performed Cognition: No apparent impairments                               Following commands: Intact       Cueing  General Comments   Cueing Techniques: Verbal cues  educated on NWB to RLE with post op shoe on, family with good teach back. educated on AE/DME, wife with good return demo   Exercises     Shoulder Instructions      Home Living Family/patient expects to be discharged to:: Private residence Living Arrangements: Spouse/significant other Available Help at Discharge: Family Type of Home: House Home Access: Stairs to enter Secretary/administrator of Steps: 5 Entrance Stairs-Rails: Can reach both Home Layout: One level     Bathroom Shower/Tub: Chief Strategy Officer: Standard Bathroom Accessibility: Yes How Accessible: Accessible via walker Home Equipment: Tub bench;Other (comment) (toilet seat frame)          Prior Functioning/Environment Prior Level of Function : Independent/Modified Independent             Mobility Comments: independent without AD ADLs Comments: independent    OT Problem List: Decreased strength;Decreased activity tolerance   OT Treatment/Interventions: Self-care/ADL  training;Energy conservation;Therapeutic activities      OT Goals(Current goals can be found in the care plan section)   Acute Rehab OT Goals Patient Stated Goal: to go home OT Goal Formulation: With patient/family Time For Goal Achievement: 07/27/24 Potential to Achieve Goals: Good ADL Goals Pt Will Perform Grooming: with modified independence;standing Pt Will Perform Lower Body Dressing: with modified independence;sit to/from stand Pt Will Transfer to Toilet: with modified independence;ambulating;regular height toilet   OT Frequency:  Min 2X/week    Co-evaluation   Reason for Co-Treatment: For patient/therapist safety PT goals addressed during session: Mobility/safety with mobility;Balance;Proper use of DME;Strengthening/ROM OT goals addressed during session: ADL's and self-care;Proper use of Adaptive equipment and DME;Strengthening/ROM      AM-PAC OT 6 Clicks Daily Activity     Outcome Measure Help from another person eating meals?: None Help from another person taking care of personal grooming?: None Help from another person toileting, which includes using toliet, bedpan, or urinal?: A Little Help from another person bathing (including washing, rinsing, drying)?: A Little Help from another person to put on and taking off regular upper body clothing?: None Help from another person to put on and taking  off regular lower body clothing?: A Little 6 Click Score: 21   End of Session Equipment Utilized During Treatment: Gait belt;Rolling walker (2 wheels) Nurse Communication: Mobility status  Activity Tolerance: Patient tolerated treatment well Patient left: in bed;with call bell/phone within reach;with bed alarm set  OT Visit Diagnosis: Unsteadiness on feet (R26.81)                Time: 8590-8565 OT Time Calculation (min): 25 min Charges:  OT General Charges $OT Visit: 1 Visit OT Evaluation $OT Eval Low Complexity: 1 Low  Rogers Clause, OT/L MSOT, 07/13/2024

## 2024-07-13 NOTE — Plan of Care (Signed)
" °  Problem: Skin Integrity: Goal: Risk for impaired skin integrity will decrease Outcome: Progressing   Problem: Education: Goal: Knowledge of General Education information will improve Description: Including pain rating scale, medication(s)/side effects and non-pharmacologic comfort measures Outcome: Progressing   Problem: Clinical Measurements: Goal: Ability to maintain clinical measurements within normal limits will improve Outcome: Progressing   Problem: Pain Managment: Goal: General experience of comfort will improve and/or be controlled Outcome: Progressing   Problem: Safety: Goal: Ability to remain free from injury will improve Outcome: Progressing   "

## 2024-07-13 NOTE — Telephone Encounter (Signed)
 Pharmacy Patient Advocate Encounter  Received notification from [employer] that Prior Authorization for FreeStyle Libre 3 Plus Sensor has been APPROVED from 07/13/24 to 07/13/25. Ran test claim, Copay is $85. This test claim was processed through [facility]- copay amounts may vary at other pharmacies due to pharmacy/plan contracts, or as the patient moves through the different stages of their insurance plan.   PA #/Case ID/Reference #: [id number]

## 2024-07-13 NOTE — Transfer of Care (Signed)
 Immediate Anesthesia Transfer of Care Note  Patient: [name]  Procedure(s) Performed: AMPUTATION, FOOT, RAY (Right: Foot)  Patient Location: PACU  Anesthesia Type:MAC  Level of Consciousness: sedated and drowsy  Airway & Oxygen Therapy: Patient Spontanous Breathing and Patient connected to face mask oxygen  Post-op Assessment: Report given to RN and Post -op Vital signs reviewed and stable  Post vital signs: stable  Last Vitals:  Vitals Value Taken Time  BP 101/69 07/13/24 09:24  Temp    Pulse 84 07/13/24 09:27  Resp 14 07/13/24 09:27  SpO2 99 % 07/13/24 09:27  Vitals shown include unfiled device data.  Last Pain:  Vitals:   07/13/24 0810  TempSrc: Temporal  PainSc: 1          Complications: No notable events documented.

## 2024-07-13 NOTE — TOC CM/SW Note (Signed)
 Transition of Care [facility]) - Inpatient Brief Assessment   Patient Details  Name: [name] MRN: [id number] Date of Birth: 1971/07/20  Transition of Care [facility]) CM/SW Contact:    Lauraine JAYSON Carpen, LCSW Phone Number: 07/13/2024, 12:15 PM   Clinical Narrative: CSW reviewed chart. Patient went to OR today for foot amputation. PT/OT evaluations are pending. Will continue to follow for their recommendations.  Transition of Care Asessment:   Patient has primary care physician: Yes Home environment has been reviewed: Single family home Prior level of function:: Not documented Prior/Current Home Services: No current home services Social Drivers of Health Review: SDOH reviewed no interventions necessary Readmission risk has been reviewed: Yes Transition of care needs: transition of care needs identified, TOC will continue to follow

## 2024-07-13 NOTE — Anesthesia Postprocedure Evaluation (Signed)
"   Anesthesia Post Note  Patient: [name]  Procedure(s) Performed: AMPUTATION, FOOT, RAY (Right: Foot)  Patient location during evaluation: PACU Anesthesia Type: General Level of consciousness: awake and alert Pain management: pain level controlled Vital Signs Assessment: post-procedure vital signs reviewed and stable Respiratory status: spontaneous breathing, nonlabored ventilation and respiratory function stable Cardiovascular status: blood pressure returned to baseline and stable Postop Assessment: no apparent nausea or vomiting Anesthetic complications: no   No notable events documented.   Last Vitals:  Vitals:   07/13/24 1022 07/13/24 1049  BP: (!) 140/90 (!) 163/91  Pulse: 77 75  Resp: 18 16  Temp: 36.7 C 36.8 C  SpO2: 97% 97%    Last Pain:  Vitals:   07/13/24 0945  TempSrc:   PainSc: 0-No pain                 Camellia Merilee Louder      "

## 2024-07-13 NOTE — Inpatient Diabetes Management (Addendum)
 Inpatient Diabetes Program Recommendations  AACE/ADA: New Consensus Statement on Inpatient Glycemic Control (2015)  Target Ranges:  Prepandial:   less than 140 mg/dL      Peak postprandial:   less than 180 mg/dL (1-2 hours)      Critically ill patients:  140 - 180 mg/dL    Latest Reference Range & Units 10/21/23 14:45 05/25/24 15:42  Hemoglobin A1C 4.6 - 6.5 % 9.3 ! Pend 11.9 (H)  295 mg/dl    Latest Reference Range & Units 07/11/24 14:23  Glucose 70 - 99 mg/dL 537 (H)  (H): Data is abnormally high  Latest Reference Range & Units 07/11/24 22:19 07/12/24 03:53 07/12/24 07:29 07/12/24 11:46 07/12/24 16:36 07/12/24 19:51  Glucose-Capillary 70 - 99 mg/dL 729 (H)  8 units Novolog   10 units Lantus  211 (H)  5 units Novolog   189 (H)  3 units Novolog   163 (H)  3 units Novolog   245 (H)  5 units Novolog   280 (H)  8 units Novolog   10 units Lantus  @2219   (H): Data is abnormally high  Latest Reference Range & Units 07/12/24 23:57 07/13/24 04:55  Glucose-Capillary 70 - 99 mg/dL 825 (H)  3 units Novolog   190 (H)  3 units Novolog    (H): Data is abnormally high   Admit with: Suspected osteomyelitis of the right foot/ Hypertensive urgency   History: DM2  Home DM Meds: Farxiga  10 mg daily (NOT taking)       Glipizide  5 mg BID (NOT taking)       Mounjaro  7.5 mg Qweek  Current Orders: Novolog  Moderate Correction Scale/ SSI (0-15 units) Q4H     Lantus  10 units at HS    NPO this AM for R foot partial 5th ray amputation vs 5th met head resection    MD- Note CBG 190 at 5am today  Please consider increasing Lantus  slightly to 12 units at [facility]    PCP: Dr. Abbey with [facility] Last Seen 05/25/2024 Continue tirzepatide  (Mounjaro ) 7.5 mg once weekly. Consider starting Farxiga  or Jardiance  based on A1c  and urine microalbumin results.  Consider restarting Glipizide  if if negative urine microalbumin level if A1c above goal.    Addendum 12:15pm--Met w/ pt  at bedside.  Only taking the Mounjaro  once weekly--NOT taking Glipizide  nor Farxiga .  Told me his PCP stopped the Glipizide  in Feb 2025--Not sure why he never started the Farxiga ?  Has 2 CBG meters at home but NEVER checks CBGs.  Started the Mounjaro  in Jan 2025--Has not seen any weight loss effects.  Still has poor diet per self report--Stays away from sugary drinks but does not make good food choices.  Told me he has lots of Cheat days.   I asked pt if he had questions about healthy eating for diabetes at home but pt denied having questions and stated he just needed to do better.  Has never tried CGM at home.  Would be interested in CGM if insurance covers it.  I reached out to OP pharmacy and they are checking.    Spoke with patient about his current A1c of  11.9%.  Explained what an A1c is and what it measures.  Reminded patient that his goal A1c is 7% or less per ADA standards to prevent both acute and long-term complications.  Explained to patient the extreme importance of good glucose control at home especially to help heal his foot post-op.  Encouraged patient to check his CBGs at least BID at home (fasting and another  check within the day) and to record all CBGs in a logbook for his PCP to review.  Reviewed goal CBGs for home.  Discussed with pt that I will review his A1c and home meds with the Attending MD.  Not sure if Attending MD will make any changes to his home DM meds.  Will follow up with pt tomorrow.    --Will follow patient during hospitalization--  Adina Rudolpho Arrow RN, MSN, CDCES Diabetes Coordinator Inpatient Glycemic Control Team Team Pager: [phone] (8a-5p)

## 2024-07-13 NOTE — Progress Notes (Signed)
 Patient currently admitted in hospital. His care team has reviewed these results.

## 2024-07-13 NOTE — Plan of Care (Signed)

## 2024-07-13 NOTE — Progress Notes (Signed)
 History and Physical Interval Note:  07/13/2024 8:31 AM  [name]  has presented today for surgery, with the diagnosis of osteomyelitis right 5th ray.  The various methods of treatment have been discussed with the patient and family. After consideration of risks, benefits and other options for treatment, the patient has consented to   Procedures with comments: AMPUTATION, FOOT, RAY (Right) - Right partial 5th ray amputation vs metatarsal head resection as a surgical intervention.  The patient's history has been reviewed, patient examined, no change in status, stable for surgery.  I have reviewed the patient's chart and labs.  Questions were answered to the patient's satisfaction.     Marsa FALCON Fawn Desrocher

## 2024-07-13 NOTE — Op Note (Signed)
 Full Operative Report  Date of Operation: 9:33 AM, 07/13/2024   Patient: [name] - 53 y.o. 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  Partial fifth ray resection, right foot    Anesthesia: General  No responsible provider has been recorded for the case.  Anesthesiologist: Vicci Camellia Glatter, MD CRNA: Dominica Krabbe, CRNA; Gillermo Spruce I, CRNA   Estimated Blood Loss: 20 mL  Hemostasis: 1) Anatomical dissection, mechanical compression, electrocautery 2) no tourniquet was used during procedure  Implants: * No implants in log *  Materials: Prolene 2-0, 3-0, skin staples  Injectables: 1) Pre-operatively: 10 cc of 50:50 mixture 1%lidocaine  plain and 0.5% marcaine  plain 2) Post-operatively: None   Specimens: - Pathology: Partial fifth ray including fifth metatarsal head and neck and fifth toe with proximal margin inked on fifth met - Microbiology: Deep tissue culture from ulceration, bone culture fifth metatarsal head   Antibiotics: IV antibiotics given per schedule on the floor  Drains: None  Complications: Patient tolerated the procedure well without complication.   Operative findings: As below in detailed report  Indications for Procedure: [name] presents to Malvin Shane FALCON, DPM with a chief complaint of chronic ulceration plantar aspect fifth MPJ with concern for underlying osteomyelitis and septic arthritis of the fifth MPJ.  The patient has failed conservative treatments of various modalities. At this time the patient has elected to proceed with surgical correction. All alternatives, risks, and complications of the procedures were thoroughly explained to the patient. Patient exhibits appropriate understanding of all discussion points and informed consent was signed and  obtained in the chart with no guarantees to surgical outcome given or implied.  Description of Procedure: Patient was brought to the operating room. Patient remained on their hospital bed in the supine position. A surgical timeout was performed and all members of the operating room, the procedure, and the surgical site were identified. anesthesia occurred as per anesthesia record. Local anesthetic as previously described was then injected about the operative field in a local infiltrative block.  The operative lower extremity as noted above was then prepped and draped in the usual sterile manner. The following procedure then began.  Attention was directed to the right lower extremity. A racquet type incision was made over the dorsal medial aspect of the fifth metatarsal, including the entire digit.  The incision was planned so as to excise the entirety of the plantar ulceration underlying the fifth MPJ.  A full thickness incision was made down to bone using a #15 blade and the wound was excised in total.  The ulceration and surrounding tissue was sent for culture. The incision was continued through the soft tissue down to the shaft of the fifth metatarsal.  No significant purulence was seen though there was necrotic and fibrotic tissues in the plantar soft tissues underlying the fifth MPJ.  Using a #15 blade, the digit was then disarticulated in its entirety at the metatarsophalangeal joint and freed of all soft tissue attachments. The specimen was passed off the field and sent for gross pathology. Next, a 15 blade was then used to free up the periosteum on the metatarsal shaft. Using a sagittal saw, the metatarsal was cut in transverse osteotomy at the fifth metatarsal distal shaft with resection of what was thought to be any infected bone and soft tissue.  The bone at the  resection margin did seem of normal quality clinically.  A bone culture was taken at this time from the fifth metatarsal head with rongeur  and sent for micro. The fifth metatarsal head and neck was inked with black ink at the proximal margin and was sent with the fifth toe for pathology.  All remaining non-viable and necrotic tissues were sharply resected and removed. Extensor and flexors tendons were grasped with a hemostat and cut proximally. The surgical site was then flushed with 1000ml of saline with pulse lavage.  The skin flap was then approximated and using a rotational flap the surgical site was able to be closed very nicely with minimal tension utilizing 2-0 and 3-0 Prolene and some skin staples as well.  The surgical site was then dressed with Xeroform 4 x 4 Kerlix ABD pad Ace wrap. The patient tolerated both the procedure and anesthesia well with vital signs stable throughout. The patient was transferred in good condition and all vital signs stable  from the OR to recovery under the discretion of anesthesia.  Condition: Vital signs stable, neurovascular status unchanged from preoperative   Surgical plan:  Expect clean surgical margin at the fifth metatarsal, however we will have to follow-up pathology when available to ensure clean margin.  Patient does not need to stay admitted for this result can follow as an outpatient.  If there is concern for residual osteomyelitis will place referral to infectious disease clinic for longer-term antibiotics.  Fortunately he has cultures from the urgent care and this can be used to guide antibiotic therapy for now.  Recommend 2 weeks of oral antibiotics post procedure including Augmentin .  Patient will likely be stable for discharge home by tomorrow from my perspective.  Recommend nonweightbearing in postop shoe using a knee scooter or crutches or walker PT OT today.  He will follow-up in our office in approximately 1 week for first dressing change in our office will call to schedule this appointment  The patient will be nonweightbearing in a postop shoe to the operative limb until further  instructed. The dressing is to remain clean, dry, and intact. Will continue to follow unless noted elsewhere.   [name], DPM [facility]

## 2024-07-13 NOTE — Evaluation (Signed)
 Physical Therapy Evaluation Patient Details Name: [name] MRN: [id number] DOB: 03/08/1971 Today's Date: 07/13/2024  History of Present Illness  Mr. Yasuda is a 53 year old gentleman with a past medical history of type 2 diabetes mellitus with hyperglycemia (last A1c 11.9%, right plantar foot ulcer since September 2025 who presents with increasing purulent foul discharge from the wound and pain.  The patient was previously seen in urgent care on 07/09/2024 underwent debridement and was instructed to go to the emergency department for MRI and further review.  The patient denies any fever chills or systemic upset.  Clinical Impression  Patient noted to be in supine position at PT arrival in room, for an initial PT evaluation due to a decline in functional status, with baseline mobility reported as independent, and currently requiring min/CGA for ambulation bouts of 5 feet x 3. The patient is A&O x 4, presenting with good willingness to work with PT and goals of going home, with discharge expectations that include HHPT. The patient resides in a house and lives with spouse with family/friend support. There are 4 STE inside the residence. Gait was assessed with RW, limited by upper extremity fatigue. Recommended skilled PT will address safety, mobility, and discharge planning.       If plan is discharge home, recommend the following: A little help with walking and/or transfers;Help with stairs or ramp for entrance;Assist for transportation;A little help with bathing/dressing/bathroom   Can travel by private vehicle        Equipment Recommendations BSC/3in1;Other (comment) (bariatric walker)  Recommendations for Other Services       Functional Status Assessment Patient has had a recent decline in their functional status and demonstrates the ability to make significant improvements in function in a reasonable and predictable amount of time.     Precautions / Restrictions  Precautions Recall of Precautions/Restrictions: Intact Restrictions Weight Bearing Restrictions Per Provider Order: Yes      Mobility  Bed Mobility Overal bed mobility: Modified Independent                  Transfers Overall transfer level: Needs assistance Equipment used: Rolling walker (2 wheels) Transfers: Sit to/from Stand Sit to Stand: Contact guard assist, Min assist                Ambulation/Gait   Gait Distance (Feet): 15 Feet Assistive device: Rolling walker (2 wheels) Gait Pattern/deviations: Step-to pattern Gait velocity: decreased     General Gait Details: 5 feet x3; does better with L shoe on; NWB RLE educated on RW management  Stairs            Wheelchair Mobility     Tilt Bed    Modified Rankin (Stroke Patients Only)       Balance Overall balance assessment: Needs assistance Sitting-balance support: Feet supported Sitting balance-Leahy Scale: Normal     Standing balance support: During functional activity, Reliant on assistive device for balance Standing balance-Leahy Scale: Good                               Pertinent Vitals/Pain      Home Living Family/patient expects to be discharged to:: Private residence Living Arrangements: Spouse/significant other Available Help at Discharge: Family Type of Home: House Home Access: Stairs to enter Entrance Stairs-Rails: Can reach both Entrance Stairs-Number of Steps: 5   Home Layout: One level Home Equipment: None;BSC/3in1      Prior Function Prior  Level of Function : Independent/Modified Independent                     Extremity/Trunk Assessment   Upper Extremity Assessment Upper Extremity Assessment: Generalized weakness    Lower Extremity Assessment Lower Extremity Assessment: Generalized weakness;RLE deficits/detail RLE Deficits / Details: 5th ray amputation    Cervical / Trunk Assessment Cervical / Trunk Assessment: Normal   Communication   Communication Communication: No apparent difficulties    Cognition Arousal: Alert Behavior During Therapy: WFL for tasks assessed/performed   PT - Cognitive impairments: No apparent impairments                         Following commands: Intact       Cueing Cueing Techniques: Verbal cues     General Comments      Exercises     Assessment/Plan    PT Assessment Patient needs continued PT services  PT Problem List Decreased strength;Decreased activity tolerance;Decreased balance;Decreased mobility       PT Treatment Interventions Gait training;Functional mobility training;Therapeutic activities;Therapeutic exercise;Patient/family education;Neuromuscular re-education    PT Goals (Current goals can be found in the Care Plan section)  Acute Rehab PT Goals Patient Stated Goal: Pt wants to go home PT Goal Formulation: With patient Time For Goal Achievement: 08/10/24 Potential to Achieve Goals: Good    Frequency Min 2X/week     Co-evaluation PT/OT/SLP Co-Evaluation/Treatment: Yes Reason for Co-Treatment: For patient/therapist safety PT goals addressed during session: Mobility/safety with mobility;Balance;Proper use of DME;Strengthening/ROM OT goals addressed during session: ADL's and self-care;Proper use of Adaptive equipment and DME;Strengthening/ROM       AM-PAC PT 6 Clicks Mobility  Outcome Measure Help needed turning from your back to your side while in a flat bed without using bedrails?: None Help needed moving from lying on your back to sitting on the side of a flat bed without using bedrails?: None Help needed moving to and from a bed to a chair (including a wheelchair)?: None Help needed standing up from a chair using your arms (e.g., wheelchair or bedside chair)?: A Little Help needed to walk in hospital room?: A Little Help needed climbing 3-5 steps with a railing? : A Lot 6 Click Score: 20    End of Session Equipment  Utilized During Treatment: Gait belt Activity Tolerance: Patient tolerated treatment well Patient left: in bed;with call bell/phone within reach;with bed alarm set;with family/visitor present Nurse Communication: Mobility status PT Visit Diagnosis: Muscle weakness (generalized) (M62.81);Other abnormalities of gait and mobility (R26.89);Other symptoms and signs involving the nervous system (R29.898);Difficulty in walking, not elsewhere classified (R26.2)    Time: 8590-8565 PT Time Calculation (min) (ACUTE ONLY): 25 min   Charges:   PT Evaluation $PT Eval Low Complexity: 1 Low   PT General Charges $$ ACUTE PT VISIT: 1 Visit         Sherlean Lesches DPT, PT    Sherlean A Shaquavia Whisonant 07/13/2024, 3:30 PM

## 2024-07-13 NOTE — Progress Notes (Signed)
 " PROGRESS NOTE    [name]  FMW:[id number] DOB: 1970-08-24 DOA: 07/11/2024 PCP: Abbey Bruckner, MD  Outpatient Specialists: podiatry ([facility])    Brief Narrative:   Mr. Callicott is a 53 year old gentleman with a past medical history of type 2 diabetes mellitus with hyperglycemia (last A1c 11.9%, right plantar foot ulcer since September 2025 who presents with increasing purulent foul discharge from the wound and pain.  The patient was previously seen in urgent care on 07/09/2024 underwent debridement and was instructed to go to the emergency department for MRI and further review.  The patient denies any fever chills or systemic upset.   Past medical records reviewed and summarized: Urgent care note from 07/09/2024: Ulcer on plantar aspect of the right foot debrided and patient discussed with on-call podiatrist patient directed to the emergency department as per the note verbatim, The on-call podiatrist, Dr. Marolyn Honour, was consulted and reviewed the imaging and wound photographs. He recommended that the patient proceed to [facility] Emergency Department tonight for MRI evaluation.  The patient's primary care note from May 25, 2024 at that point the patient was on Mounjaro  and A1c was checked which resulted at 11.9% The patient's podiatry note from 04/14/2024 had a right foot ulcer follow-up at that time   Patient was discussed with emergency department physician and patient subsequently admitted, case also discussed with Dr. Honour who will see the patient kindly in follow-up consultation.   Assessment & Plan:   Principal Problem:   Osteomyelitis (HCC) Active Problems:   Hypertensive urgency   HLD (hyperlipidemia)   Hypertension associated with diabetes (HCC)   OSA (obstructive sleep apnea)   DM2 (diabetes mellitus, type 2) (HCC)   Morbid obesity (HCC)  # Osteomyelitis 5th metatarsal head, possibly 5th proximal phalanx. 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 A1c 11s last month. On mounjaro  - ssi - glargine 10 > 12, titrate as needed - would advise starting insulin  at discharge - dm educator consulted  # HTN Uncontrolled and contributing to presenting problem. Admitted for thiazide-induced hypokalemia last year - cont home lisinopril  40, amlod 10 - increased home spiro from 12.5 to 50 - started coreg  6.25 bid  # Morbid obesity Contributing to underlying comorbidities  # OSA - cpap qhs   DVT prophylaxis: lovenox  Code Status: full Family Communication: wife at bedside 12/22  Level of care: Med-Surg Status is: Inpatient Remains inpatient appropriate because: severity of illness    Consultants:  podiatry   Subjective: Right foot pain otherwise feeling ok  Objective: Vitals:   07/13/24 0930 07/13/24 0945 07/13/24 1022 07/13/24 1049  BP: 109/62 132/76 (!) 140/90 (!) 163/91  Pulse: 82 85 77 75  Resp: 13 16 18 16   Temp:   98 F (36.7 C) 98.2 F (36.8 C)  TempSrc:      SpO2: 98% 96% 97% 97%  Weight:      Height:        Intake/Output Summary (Last 24 hours) at 07/13/2024 1410 Last data filed at 07/13/2024 1300 Gross per 24 hour  Intake 791.54 ml  Output 0 ml  Net 791.54 ml   Filed Weights   07/11/24 1422 07/13/24  0810  Weight: (!) 147 kg (!) 145.2 kg    Examination:  General exam: Appears calm and comfortable  Respiratory system: Clear to auscultation. Respiratory effort normal. Cardiovascular system: S1 & S2 heard, RRR. No JVD, murmurs, rubs, gallops or clicks. No pedal edema. Gastrointestinal system: Abdomen is obese, soft and nontender.   Central nervous system: Alert and  oriented. No focal neurological deficits. Extremities: right foot bandaged Skin: No rashes, lesions or ulcers Psychiatry: Judgement and insight appear normal. Mood & affect appropriate.     Data Reviewed: I have personally reviewed following labs and imaging studies  CBC: Recent Labs  Lab 07/09/24 1623 07/11/24 1423 07/11/24 2203 07/12/24 0814  WBC 11.1* 9.2 8.5 8.2  NEUTROABS 7.1 6.2  --   --   HGB 13.8 13.9 13.8 14.0  HCT 39.6 41.8 40.3 40.6  MCV 77.2* 80.1 79.0* 78.1*  PLT 324 328 302 325   Basic Metabolic Panel: Recent Labs  Lab 07/11/24 1423 07/11/24 2203 07/12/24 0814 07/13/24 0603  NA 132*  --  136 137  K 4.0  --  3.4* 3.2*  CL 96*  --  101 101  CO2 24  --  25 26  GLUCOSE 462*  --  200* 181*  BUN 8  --  7 9  CREATININE 0.69 0.59* 0.51* 0.65  CALCIUM  8.7*  --  8.6* 8.7*   GFR: Estimated Creatinine Clearance: 152.4 mL/min (by C-G formula based on SCr of 0.65 mg/dL). Liver Function Tests: Recent Labs  Lab 07/11/24 1423 07/12/24 0814  AST 28 24  ALT 23 24  ALKPHOS 100 90  BILITOT 0.3 0.5  PROT 7.0 6.8  ALBUMIN 3.6 3.4*   No results for input(s): LIPASE, AMYLASE in the last 168 hours. No results for input(s): AMMONIA in the last 168 hours. Coagulation Profile: No results for input(s): INR, PROTIME in the last 168 hours. Cardiac Enzymes: No results for input(s): CKTOTAL, CKMB, CKMBINDEX, TROPONINI in the last 168 hours. BNP (last 3 results) No results for input(s): PROBNP in the last 8760 hours. HbA1C: No results for input(s): HGBA1C in the last 72 hours. CBG: Recent Labs  Lab 07/12/24 1951 07/12/24 2357 07/13/24 0455 07/13/24 0927 07/13/24 1053  GLUCAP 280* 174* 190* 173* 176*   Lipid Profile: No results for input(s): CHOL, HDL, LDLCALC, TRIG, CHOLHDL, LDLDIRECT in the last 72 hours. Thyroid Function Tests: No results for input(s): TSH, T4TOTAL, FREET4, T3FREE, THYROIDAB in the last 72  hours. Anemia Panel: No results for input(s): VITAMINB12, FOLATE, FERRITIN, TIBC, IRON, RETICCTPCT in the last 72 hours. Urine analysis:    Component Value Date/Time   COLORURINE YELLOW (A) 08/25/2023 1455   APPEARANCEUR HAZY (A) 08/25/2023 1455   LABSPEC 1.029 08/25/2023 1455   PHURINE 5.0 08/25/2023 1455   GLUCOSEU >=500 (A) 08/25/2023 1455   GLUCOSEU >=1000 (A) 03/21/2023 1550   HGBUR SMALL (A) 08/25/2023 1455   BILIRUBINUR NEGATIVE 08/25/2023 1455   KETONESUR 20 (A) 08/25/2023 1455   PROTEINUR 100 (A) 08/25/2023 1455   UROBILINOGEN 0.2 03/21/2023 1550   NITRITE NEGATIVE 08/25/2023 1455   LEUKOCYTESUR NEGATIVE 08/25/2023 1455   Sepsis Labs: @LABRCNTIP (procalcitonin:4,lacticidven:4)  ) Recent Results (from the past 240 hours)  Aerobic Culture w Gram Stain (superficial specimen)     Status: None   Collection Time: 07/09/24  6:54 PM   Specimen: Wound  Result Value Ref Range Status   Specimen Description WOUND  Final   Special Requests RF  Final   Gram Stain   Final  NO WBC SEEN FEW GRAM POSITIVE COCCI IN PAIRS RARE GRAM NEGATIVE RODS    Culture   Final    MODERATE PROVIDENCIA RETTGERI MODERATE GROUP B STREP(S.AGALACTIAE)ISOLATED TESTING AGAINST S. AGALACTIAE NOT ROUTINELY PERFORMED DUE TO PREDICTABILITY OF AMP/PEN/VAN SUSCEPTIBILITY. WITHIN MIXED ORGANISMS Performed at [facility], [address] [place], [place] [address]    Report Status 07/11/2024 FINAL  Final   Organism ID, Bacteria PROVIDENCIA RETTGERI  Final      Susceptibility   Providencia rettgeri - MIC*    AMPICILLIN RESISTANT Resistant     CEFEPIME <=0.12 SENSITIVE Sensitive     ERTAPENEM <=0.12 SENSITIVE Sensitive     CEFTRIAXONE  <=0.25 SENSITIVE Sensitive     CIPROFLOXACIN <=0.06 SENSITIVE Sensitive     GENTAMICIN <=1 SENSITIVE Sensitive     MEROPENEM <=0.25 SENSITIVE Sensitive     TRIMETH /SULFA  <=20 SENSITIVE Sensitive     AMPICILLIN/SULBACTAM <=2 SENSITIVE Sensitive      PIP/TAZO Value in next row Sensitive      <=4 SENSITIVEThis is a modified FDA-approved test that has been validated and its performance characteristics determined by the reporting laboratory.  This laboratory is certified under the Clinical Laboratory Improvement Amendments CLIA as qualified to perform high complexity clinical laboratory testing.    * MODERATE PROVIDENCIA RETTGERI  Culture, blood (Routine X 2) w Reflex to ID Panel     Status: None (Preliminary result)   Collection Time: 07/11/24 10:07 PM   Specimen: BLOOD  Result Value Ref Range Status   Specimen Description BLOOD BLOOD RIGHT HAND  Final   Special Requests   Final    BOTTLES DRAWN AEROBIC AND ANAEROBIC Blood Culture adequate volume   Culture   Final    NO GROWTH 1 DAY Performed at [facility], [address] [place], [place] [address]    Report Status PENDING  Incomplete  Culture, blood (Routine X 2) w Reflex to ID Panel     Status: None (Preliminary result)   Collection Time: 07/12/24 12:33 AM   Specimen: BLOOD  Result Value Ref Range Status   Specimen Description BLOOD BLOOD LEFT HAND  Final   Special Requests   Final    BOTTLES DRAWN AEROBIC AND ANAEROBIC Blood Culture results may not be optimal due to an inadequate volume of blood received in culture bottles   Culture   Final    NO GROWTH 1 DAY Performed at [facility], [address] [place], [place] [address]    Report Status PENDING  Incomplete  Aerobic Culture w Gram Stain (superficial specimen)     Status: None (Preliminary result)   Collection Time: 07/13/24  9:00 AM   Specimen: Foot, Right; Tissue  Result Value Ref Range Status   Specimen Description   Final    TISSUE Performed at [facility], [address] [place], [place] [address]    Special Requests   Final    AMPUTATION, RIGHT FOOT, RAY Performed at [facility], [address] [place], [place] [address]    Gram Stain PENDING  Incomplete   Culture  PENDING  Incomplete   Report Status PENDING  Incomplete         Radiology Studies: MR FOOT RIGHT WO CONTRAST Result Date: 07/13/2024 CLINICAL DATA:  Right plantar foot wound.  History of diabetes. EXAM: MRI OF THE RIGHT FOREFOOT WITHOUT CONTRAST TECHNIQUE: Multiplanar, multisequence MR imaging of the right foot was performed. No intravenous contrast was administered. COMPARISON:  Right foot radiographs  dated 07/09/2024. FINDINGS: Soft tissue/Bones/Joint/Cartilage Soft tissue ulceration of the plantar forefoot, overlying the level of the fifth metatarsal head and fifth MTP joint. There is T2 hyperintense marrow signal abnormality with corresponding T1 hypointensity of the fifth metatarsal head and T2 hyperintense marrow signal abnormality with less pronounced mild T1 hypointensity of the base of the fifth proximal phalanx with a fifth MTP joint effusion. These findings are most compatible with osteomyelitis/septic arthritis. No acute fracture or dislocation. Mild degenerative changes of the first MTP joint and midfoot. Ligaments Collateral ligaments are intact.  Lisfranc ligament is intact. Muscles and Tendons Flexor and extensor compartment tendons are intact. Mild atrophy of the intrinsic foot musculature with increased T2 hyperintense signal, likely reflecting chronic denervation changes. Soft tissue No loculated fluid collection. Subcutaneous edema extending along the dorsal foot. IMPRESSION: Findings most compatible with osteomyelitis/septic arthritis of the fifth MTP joint involving the fifth metatarsal head and the base of the fifth proximal phalanx. Overlying soft tissue ulceration of the plantar lateral forefoot. No loculated fluid collection. Electronically Signed   By: Harrietta Sherry M.D.   On: 07/13/2024 08:27        Scheduled Meds:  amLODipine   10 mg Oral Daily   amoxicillin -clavulanate  1 tablet Oral Q12H   carvedilol   6.25 mg Oral BID WC   doxycycline   100 mg Oral Q12H    insulin  aspart  0-15 Units Subcutaneous Q4H   insulin  glargine  10 Units Subcutaneous QHS   lisinopril   40 mg Oral Daily   rosuvastatin   20 mg Oral Daily   spironolactone   50 mg Oral Daily   Continuous Infusions:     LOS: 2 days     Devaughn KATHEE Ban, MD [facility]   If 7PM-7AM, please contact night-coverage [email or web address] Password [email or web address] 07/13/2024, 2:10 PM     "

## 2024-07-14 ENCOUNTER — Encounter: Payer: Self-pay | Admitting: Podiatry

## 2024-07-14 ENCOUNTER — Other Ambulatory Visit: Payer: Self-pay

## 2024-07-14 LAB — GLUCOSE, CAPILLARY
Glucose-Capillary: 167 mg/dL — ABNORMAL HIGH (ref 70–99)
Glucose-Capillary: 171 mg/dL — ABNORMAL HIGH (ref 70–99)
Glucose-Capillary: 181 mg/dL — ABNORMAL HIGH (ref 70–99)

## 2024-07-14 LAB — BASIC METABOLIC PANEL WITH GFR
Anion gap: 9 (ref 5–15)
BUN: 9 mg/dL (ref 6–20)
CO2: 27 mmol/L (ref 22–32)
Calcium: 8.5 mg/dL — ABNORMAL LOW (ref 8.9–10.3)
Chloride: 100 mmol/L (ref 98–111)
Creatinine, Ser: 0.6 mg/dL — ABNORMAL LOW (ref 0.61–1.24)
GFR, Estimated: >60 mL/min
Glucose, Bld: 208 mg/dL — ABNORMAL HIGH (ref 70–99)
Potassium: 3.2 mmol/L — ABNORMAL LOW (ref 3.5–5.1)
Sodium: 136 mmol/L (ref 135–145)

## 2024-07-14 MED ORDER — SPIRONOLACTONE 50 MG PO TABS
50.0000 mg | ORAL_TABLET | Freq: Every day | ORAL | 0 refills | Status: DC
  Filled 2024-07-14: qty 30, 30d supply, fill #0

## 2024-07-14 MED ORDER — PEN NEEDLES 32G X 4 MM MISC
1.0000 | Freq: Every evening | 1 refills | Status: AC
  Filled 2024-07-14: qty 100, 30d supply, fill #0

## 2024-07-14 MED ORDER — INSULIN GLARGINE 100 UNIT/ML SOLOSTAR PEN
10.0000 [IU] | PEN_INJECTOR | Freq: Every day | SUBCUTANEOUS | 11 refills | Status: DC
  Filled 2024-07-14: qty 3, 30d supply, fill #0

## 2024-07-14 MED ORDER — AMOXICILLIN-POT CLAVULANATE 875-125 MG PO TABS
1.0000 | ORAL_TABLET | Freq: Two times a day (BID) | ORAL | 0 refills | Status: DC
  Filled 2024-07-14: qty 26, 13d supply, fill #0

## 2024-07-14 MED ORDER — DOXYCYCLINE HYCLATE 100 MG PO TABS
100.0000 mg | ORAL_TABLET | Freq: Two times a day (BID) | ORAL | 0 refills | Status: AC
  Filled 2024-07-14: qty 26, 13d supply, fill #0

## 2024-07-14 MED ORDER — OXYCODONE HCL 5 MG PO TABS
5.0000 mg | ORAL_TABLET | Freq: Four times a day (QID) | ORAL | 0 refills | Status: DC | PRN
  Filled 2024-07-14: qty 20, 5d supply, fill #0

## 2024-07-14 MED ORDER — CARVEDILOL 6.25 MG PO TABS
6.2500 mg | ORAL_TABLET | Freq: Two times a day (BID) | ORAL | 0 refills | Status: DC
  Filled 2024-07-14: qty 60, 30d supply, fill #0

## 2024-07-14 NOTE — TOC CM/SW Note (Cosign Needed Addendum)
 Transition of Care (TOC) CM/SW Note   .Patient is not able to walk the distance required to go the bathroom, or he/she is unable to safely negotiate stairs required to access the bathroom.  A Bariatric 3in1 BSC will alleviate this problem

## 2024-07-14 NOTE — Inpatient Diabetes Management (Addendum)
 Inpatient Diabetes Program Recommendations  AACE/ADA: New Consensus Statement on Inpatient Glycemic Control (2015)  Target Ranges:  Prepandial:   less than 140 mg/dL      Peak postprandial:   less than 180 mg/dL (1-2 hours)      Critically ill patients:  140 - 180 mg/dL    Latest Reference Range & Units 07/12/24 23:57 07/13/24 04:55 07/13/24 09:27 07/13/24 10:53 07/13/24 15:06 07/13/24 20:58  Glucose-Capillary 70 - 99 mg/dL 825 (H)   809 (H)   826 (H) 176 (H) 293 (H) 245 (H)  (H): Data is abnormally high  Latest Reference Range & Units 07/14/24 00:45 07/14/24 04:35 07/14/24 07:51  Glucose-Capillary 70 - 99 mg/dL 818 (H)  3 units Novolog   171 (H)  3 units Novolog   167 (H)  3 units Novolog    (H): Data is abnormally high  Admit with: Suspected osteomyelitis of the right foot/ Hypertensive urgency    History: DM2   Home DM Meds: Farxiga  10 mg daily (NOT taking)                             Glipizide  5 mg BID (NOT taking)                             Mounjaro  7.5 mg Qweek   Current Orders: Novolog  Moderate Correction Scale/ SSI (0-15 units) Q4H                           Lantus  12 units at HS   Stop Glipizide  and Farxiga  at home Start Lantus  10 units daily Continue Mounjaro   Pt has been approved for [employer] 3 CGM $85 co-pay  Gave pt 2 samples of the [employer] 3 glucose sensors.  Pt told me his wife uses these glucose sensors and can help him apply the 1st one when he goes home.  Reminded pt about the lag time between fingerstick CBG and CGM reading.  Pt told me he will use the 2 sensors and decide if he wants to continue to use them and pay the $85 co-pay--Pt knows he will need to get refills for the FSL3 with his PCP.  Also discussed going home on insulin  with pt.  Reviewed Lantus  insulin  (what it is, how it works, when to take).  Pt familiar with injections as he uses Mounjaro  at home.  Explained to pt that he will need to attach a needle and remove the needle  with each injection.  Reviewed storage of insulin , holding the needle in the skin for 10 seconds after injection.  Pt told me his wife uses insulin  pens and can help him with the first few doses.     PCP: Dr. Abbey with [facility] Last Seen 05/25/2024 Continue tirzepatide  (Mounjaro ) 7.5 mg once weekly. Consider starting Farxiga  or Jardiance  based on A1c  and urine microalbumin results.  Consider restarting Glipizide  if if negative urine microalbumin level if A1c above goal.       --Will follow patient during hospitalization--  Adina Rudolpho Arrow RN, MSN, CDCES Diabetes Coordinator Inpatient Glycemic Control Team Team Pager: [phone] (8a-5p)

## 2024-07-14 NOTE — Progress Notes (Signed)
 Pt is in the room waiting to be discharged. Pt has belongings in hand. Meds were delivered to bedside. Pt given discharge instructions and voiced understanding. IV removed with catheter intact. Dry dressing to removal site. Staff to wheel to discharge lounge to wait for family to pick up.

## 2024-07-14 NOTE — Progress Notes (Signed)
 Physical Therapy Treatment Patient Details Name: [name] MRN: [id number] DOB: 22-May-1971 Today's Date: 07/14/2024   History of Present Illness [name] is a 53 year old gentleman with a past medical history of type 2 diabetes mellitus with hyperglycemia (last A1c 11.9%, right plantar foot ulcer since September 2025 who presents with increasing purulent foul discharge from the wound and pain.  The patient was previously seen in urgent care on 07/09/2024 underwent debridement and was instructed to go to the emergency department for MRI and further review.  The patient denies any fever chills or systemic upset.    PT Comments  Pt was long sitting in recliner upon arrival. He is A and O x 4. Agreeable to session. Pt knew he is NWB and but did require min assist to safely apply post op shoe (RLE) and regular shoe on LLE. Session focused on stair training to simulate home environment. Pt did perform ascending/descending 4 stairs with BUE support. Struggles to maintain NWB however did put forth great effort. Discussed need to use knee scooter at home to maintain NWB RLE for longer distances. Pt states he has Knee scooter at home already but does need BRW/BBSC. HHPT recommended to maximize his independence and safety with all ADLs.    If plan is discharge home, recommend the following: A little help with walking and/or transfers;Help with stairs or ramp for entrance;Assist for transportation;A little help with bathing/dressing/bathroom     Equipment Recommendations  Rolling walker (2 wheels);BSC/3in1 (Bariatric)       Precautions / Restrictions Precautions Precautions: Fall Recall of Precautions/Restrictions: Intact Restrictions Weight Bearing Restrictions Per Provider Order: Yes RLE Weight Bearing Per Provider Order: Non weight bearing Other Position/Activity Restrictions: with post op shoe on     Mobility  Bed Mobility  General bed mobility comments: Pt was in recliner  pre/post session    Transfers Overall transfer level: Needs assistance Equipment used: Rolling walker (2 wheels) Transfers: Sit to/from Stand Sit to Stand: Contact guard assist  General transfer comment: CGA for safety with vcs for handplacement    Ambulation/Gait Ambulation/Gait assistance: Contact guard assist Gait Distance (Feet): 12 Feet Assistive device: Rolling walker (2 wheels) (Bariatric) Gait Pattern/deviations: Step-to pattern ( hop to) Gait velocity: decreased  General Gait Details: 2 x 6 ft from recliner to stairs to simulate home entry   Stairs Stairs: Yes Stairs assistance: Min assist, Contact guard assist Stair Management: Two rails, Step to pattern, Forwards Number of Stairs: 4 General stair comments: Pt did perform stairs to simulate home entry. struggles to maintain NWB but did put forth great effort. He endorses confidence in abilities to safely enter/exit home when cleared medically   Balance Overall balance assessment: Needs assistance Sitting-balance support: Feet supported Sitting balance-Leahy Scale: Normal     Standing balance support: Bilateral upper extremity supported, During functional activity, Reliant on assistive device for balance Standing balance-Leahy Scale: Fair       Hotel Manager: No apparent difficulties  Cognition Arousal: Alert Behavior During Therapy: WFL for tasks assessed/performed   PT - Cognitive impairments: No apparent impairments    PT - Cognition Comments: Pt is A and O x4 Following commands: Intact      Cueing Cueing Techniques: Verbal cues, Tactile cues     General Comments General comments (skin integrity, edema, etc.): Pt fatigues quickly with minimal standing activity. Encouraged p to use knee scooter at home for energy conservation. Pt elected not to get w/c      Pertinent Vitals/Pain Pain  Assessment Pain Assessment: 0-10 Pain Score: 2  Pain Location: R foot Pain  Descriptors / Indicators: Aching Pain Intervention(s): Limited activity within patient's tolerance, Monitored during session, Premedicated before session, Repositioned     PT Goals (current goals can now be found in the care plan section) Acute Rehab PT Goals Patient Stated Goal: go home today Progress towards PT goals: Progressing toward goals    Frequency    Min 2X/week           Co-evaluation     PT goals addressed during session: Mobility/safety with mobility;Balance;Proper use of DME;Strengthening/ROM        AM-PAC PT 6 Clicks Mobility   Outcome Measure  Help needed turning from your back to your side while in a flat bed without using bedrails?: A Little Help needed moving from lying on your back to sitting on the side of a flat bed without using bedrails?: A Little Help needed moving to and from a bed to a chair (including a wheelchair)?: A Little Help needed standing up from a chair using your arms (e.g., wheelchair or bedside chair)?: A Little Help needed to walk in hospital room?: A Little Help needed climbing 3-5 steps with a railing? : A Little 6 Click Score: 18    End of Session   Activity Tolerance: Patient tolerated treatment well;Patient limited by fatigue Patient left: in chair;with call bell/phone within reach;with chair alarm set Nurse Communication: Mobility status PT Visit Diagnosis: Muscle weakness (generalized) (M62.81);Other abnormalities of gait and mobility (R26.89);Other symptoms and signs involving the nervous system (R29.898);Difficulty in walking, not elsewhere classified (R26.2)     Time: 9195-9171 PT Time Calculation (min) (ACUTE ONLY): 24 min  Charges:    $Gait Training: 8-22 mins $Therapeutic Activity: 8-22 mins PT General Charges $$ ACUTE PT VISIT: 1 Visit                    [name] PTA 07/14/2024, 8:37 AM

## 2024-07-14 NOTE — Discharge Summary (Signed)
 [name] FMW:[id number] DOB: 22-Jul-1971 DOA: 07/11/2024  PCP: Abbey Bruckner, MD  Admit date: 07/11/2024 Discharge date: 07/14/2024  Time spent: 35 minutes  Recommendations for Outpatient Follow-up:  Pcp f/u 2 weeks needs bmp then, attention to BP and glucose control Podiatry f/u 1 week     Discharge Diagnoses:  Principal Problem:   Osteomyelitis (HCC) Active Problems:   Hypertensive urgency   HLD (hyperlipidemia)   Hypertension associated with diabetes (HCC)   OSA (obstructive sleep apnea)   DM2 (diabetes mellitus, type 2) (HCC)   Morbid obesity (HCC)   Discharge Condition: stable  Diet recommendation: carb modified  Filed Weights   07/11/24 1422 07/13/24 0810  Weight: (!) 147 kg (!) 145.2 kg    History of present illness:  From admission h and p [name] is a 53 year old gentleman with a past medical history of type 2 diabetes mellitus with hyperglycemia (last A1c 11.9%, [name] plantar foot ulcer since September 2025 who presents with increasing purulent foul discharge from the wound and pain.  The Shane was previously seen in urgent care on 07/09/2024 underwent debridement and was instructed to go to the emergency department for MRI and further review.  The Shane denies any fever chills or systemic upset.   Past medical records reviewed and summarized: Urgent care note from 07/09/2024: Ulcer on plantar aspect of the [name] foot debrided and Shane discussed with on-call podiatrist Shane directed to the emergency department as per the note verbatim, The on-call podiatrist, Dr. Marolyn Honour, was consulted and reviewed the imaging and wound photographs. He recommended that the Shane proceed to [facility] Emergency Department tonight for MRI evaluation.  The Shane's primary care note from May 25, 2024 at that point the Shane was on Mounjaro  and A1c was checked which resulted at 11.9% The Shane's podiatry note from 04/14/2024 had a  [name] foot ulcer follow-up at that time   Shane was discussed with emergency department physician and Shane subsequently admitted, case also discussed with Dr. Honour who will see the Shane kindly in follow-up consultation.  Hospital Course:   # Osteomyelitis 5th metatarsal head, possibly 5th proximal phalanx. S/p partial 5th ray [name] with podiatry 12/22, margins appear clean - f/u path - de-escalate to augmentin /doxy, podiatry advises 2 weeks of abx - nwb in postop show using knee scooter or crutches or walker - pt/ot home health ordered - podiatry f/u 1 week, leave dressing in place - home with walker and bedside commode   # T2DM Uncontrolled and contributing to presenting problem. A1c 11s last month. On mounjaro  - starting insulin  - close pcp f/u   # HTN Uncontrolled and contributing to presenting problem. 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 Ongoing hypokalemia, likely hyperaodosteronism (can't evaluate as already on spironolactone ) - cont home lisinopril  40, amlod 10 - increased home spiro from 12.5 to 50 - started coreg  6.25 bid - bmp 2 weeks   # Morbid obesity Contributing to underlying comorbidities   # OSA - cpap qhs  Procedures:  Partial fifth ray resection, [name] foot   Consultations: Podiatry, dm educator  Discharge Exam: Vitals:   07/13/24 2019 07/14/24 0754  BP: (!) 165/100 (!) 154/87  Pulse: 80 85  Resp: 18 20  Temp: 98 F (36.7 C) (!) 97.4 F (36.3 C)  SpO2: 95% 94%    General: NADR Cardiovascular: rrr Respiratory: ctab Ext: [name] foot dressed  Discharge Instructions   Discharge Instructions     Diet Carb Modified   Complete by: As directed  Increase activity slowly   Complete by: As directed    Leave dressing on - Keep it clean, dry, and intact until clinic visit   Complete by: As directed       Allergies as of 07/14/2024       Reactions   Lodine [etodolac] Swelling        Medication List      STOP taking these medications    dapagliflozin  propanediol 10 MG Tabs tablet Commonly known as: Farxiga    glipiZIDE  5 MG tablet Commonly known as: GLUCOTROL        TAKE these medications    amLODipine  10 MG tablet Commonly known as: NORVASC  Take 1 tablet (10 mg total) by mouth daily.   amoxicillin -clavulanate 875-125 MG tablet Commonly known as: AUGMENTIN  Take 1 tablet by mouth every 12 (twelve) hours for 13 days.   carvedilol  6.25 MG tablet Commonly known as: COREG  Take 1 tablet (6.25 mg total) by mouth 2 (two) times daily with a meal.   doxycycline  100 MG tablet Commonly known as: VIBRA -TABS Take 1 tablet (100 mg total) by mouth every 12 (twelve) hours for 13 days.   insulin  glargine 100 UNIT/ML Solostar Pen Commonly known as: LANTUS  Inject 10 Units into the skin daily.   lisinopril  40 MG tablet Commonly known as: ZESTRIL  Take 1 tablet (40 mg total) by mouth daily.   oxyCODONE  5 MG immediate release tablet Commonly known as: Oxy IR/ROXICODONE  Take 1 tablet (5 mg total) by mouth every 6 (six) hours as needed for moderate pain (pain score 4-6) or severe pain (pain score 7-10).   Pen Needles 32G X 4 MM Misc 1 each by Does not apply route at bedtime.   rosuvastatin  20 MG tablet Commonly known as: Crestor  Take 1 tablet (20 mg total) by mouth daily.   spironolactone  50 MG tablet Commonly known as: ALDACTONE  Take 1 tablet (50 mg total) by mouth daily. Start taking on: July 15, 2024 What changed:  medication strength how much to take   tirzepatide  7.5 MG/0.5ML Pen Commonly known as: MOUNJARO  Inject 7.5 mg into the skin once a week.               Durable Medical Equipment  (From admission, onward)           Start     Ordered   07/14/24 0832  For home use only DME Bedside commode  Once       Comments: bariatric  Question:  Shane needs a bedside commode to treat with the following condition  Answer:  [name], [name], [name], [name],  [name]   07/14/24 0831   07/14/24 0831  For home use only DME Walker rolling  Once       Comments: bariatric  Question Answer Comment  Walker: With 5 Inch Wheels   Shane needs a walker to treat with the following condition [name], [name], [name], [name], initial encounter      07/14/24 0831              Discharge Care Instructions  (From admission, onward)           Start     Ordered   07/14/24 0000  Leave dressing on - Keep it clean, dry, and intact until clinic visit        07/14/24 0912           Allergies[1]  Follow-up Information     Bair, Luke, MD Follow up.   Specialty: Family Medicine Contact information: [address]  [address] [place] [address] [phone]         Standiford, Marsa FALCON, DPM Follow up.   Specialty: Podiatry Why: 1 week Contact information: [address] [place] [address] [phone]                  The results of significant diagnostics from this hospitalization (including imaging, microbiology, ancillary and laboratory) are listed below for reference.    Significant Diagnostic Studies: DG Foot 2 Views [name] Result Date: 07/13/2024 CLINICAL DATA:  Postop. EXAM: [name] FOOT - 2 VIEW COMPARISON:  Preoperative imaging FINDINGS: Interval transmetatarsal [name] of the fifth ray. Expected postsurgical change in the overlying soft tissues with skin staples in place. No fracture or sites of bony destruction. IMPRESSION: Interval transmetatarsal [name] of the fifth ray. Electronically Signed   By: Andrea Gasman M.D.   On: 07/13/2024 16:46   MR FOOT [name] WO CONTRAST Result Date: 07/13/2024 CLINICAL DATA:  [name] plantar foot wound.  History of diabetes. EXAM: MRI OF THE [name] FOREFOOT WITHOUT CONTRAST TECHNIQUE: Multiplanar, multisequence MR imaging of the [name] foot was performed. No intravenous contrast was administered. COMPARISON:  [name] foot radiographs dated 07/09/2024.  FINDINGS: Soft tissue/Bones/Joint/Cartilage Soft tissue ulceration of the plantar forefoot, overlying the level of the fifth metatarsal head and fifth MTP joint. There is T2 hyperintense marrow signal abnormality with corresponding T1 hypointensity of the fifth metatarsal head and T2 hyperintense marrow signal abnormality with less pronounced mild T1 hypointensity of the base of the fifth proximal phalanx with a fifth MTP joint effusion. These findings are most compatible with osteomyelitis/septic arthritis. No acute fracture or dislocation. Mild degenerative changes of the first MTP joint and midfoot. Ligaments Collateral ligaments are intact.  Lisfranc ligament is intact. Muscles and Tendons Flexor and extensor compartment tendons are intact. Mild atrophy of the intrinsic foot musculature with increased T2 hyperintense signal, likely reflecting chronic denervation changes. Soft tissue No loculated fluid collection. Subcutaneous edema extending along the dorsal foot. IMPRESSION: Findings most compatible with osteomyelitis/septic arthritis of the fifth MTP joint involving the fifth metatarsal head and the base of the fifth proximal phalanx. Overlying soft tissue ulceration of the plantar lateral forefoot. No loculated fluid collection. Electronically Signed   By: Harrietta Sherry M.D.   On: 07/13/2024 08:27   DG Foot Complete [name] Result Date: 07/09/2024 EXAM: 3 OR MORE VIEW(S) XRAY OF THE [name] FOOT 07/09/2024 07:22:56 PM COMPARISON: None available. CLINICAL HISTORY: wound to the plantar aspect of [name] foot. history of poorly controlled diabetes FINDINGS: BONES AND JOINTS: No acute fracture. No malalignment. Plantar and dorsal calcaneal spurring. No osseous erosions to suggest osteomyelitis. SOFT TISSUES: Wound within the plantar foot at the level of the metatarsal heads, with soft tissue gas. Diffuse edema throughout the foot, asymmetric and more severe within the forefoot. IMPRESSION: 1. Wound within the  plantar foot at the level of the metatarsal heads, with soft tissue gas. 2. No osseous erosions to suggest osteomyelitis. 3. Diffuse edema throughout the foot, asymmetric and more severe within the forefoot. 4. Plantar and dorsal calcaneal spurring. Electronically signed by: Dorethia Molt MD 07/09/2024 07:28 PM EST RP Workstation: [id number]    Microbiology: Recent Results (from the past 240 hours)  Aerobic Culture w Gram Stain (superficial specimen)     Status: None   Collection Time: 07/09/24  6:54 PM   Specimen: Wound  Result Value Ref Range Status   Specimen Description WOUND  Final   Special Requests RF  Final  Gram Stain   Final    NO WBC SEEN FEW GRAM POSITIVE COCCI IN PAIRS RARE GRAM NEGATIVE RODS    Culture   Final    MODERATE PROVIDENCIA RETTGERI MODERATE GROUP B STREP(S.AGALACTIAE)ISOLATED TESTING AGAINST S. AGALACTIAE NOT ROUTINELY PERFORMED DUE TO PREDICTABILITY OF AMP/PEN/VAN SUSCEPTIBILITY. WITHIN MIXED ORGANISMS Performed at [facility], [address], [place], [place] [address]    Report Status 07/11/2024 FINAL  Final   Organism ID, Bacteria PROVIDENCIA RETTGERI  Final      Susceptibility   Providencia rettgeri - MIC*    AMPICILLIN RESISTANT Resistant     CEFEPIME <=0.12 SENSITIVE Sensitive     ERTAPENEM <=0.12 SENSITIVE Sensitive     CEFTRIAXONE  <=0.25 SENSITIVE Sensitive     CIPROFLOXACIN <=0.06 SENSITIVE Sensitive     GENTAMICIN <=1 SENSITIVE Sensitive     MEROPENEM <=0.25 SENSITIVE Sensitive     TRIMETH /SULFA  <=20 SENSITIVE Sensitive     AMPICILLIN/SULBACTAM <=2 SENSITIVE Sensitive     PIP/TAZO Value in next row Sensitive      <=4 SENSITIVEThis is a modified FDA-approved test that has been validated and its performance characteristics determined by the reporting laboratory.  This laboratory is certified under the Clinical Laboratory Improvement Amendments CLIA as qualified to perform high complexity clinical laboratory testing.    * MODERATE  PROVIDENCIA RETTGERI  Culture, blood (Routine X 2) w Reflex to ID Panel     Status: None (Preliminary result)   Collection Time: 07/11/24 10:07 PM   Specimen: BLOOD  Result Value Ref Range Status   Specimen Description BLOOD BLOOD [name] HAND  Final   Special Requests   Final    BOTTLES DRAWN AEROBIC AND ANAEROBIC Blood Culture adequate volume   Culture   Final    NO GROWTH 2 DAYS Performed at [facility], [address], [place], [place] [address]    Report Status PENDING  Incomplete  Culture, blood (Routine X 2) w Reflex to ID Panel     Status: None (Preliminary result)   Collection Time: 07/12/24 12:33 AM   Specimen: BLOOD  Result Value Ref Range Status   Specimen Description BLOOD BLOOD LEFT HAND  Final   Special Requests   Final    BOTTLES DRAWN AEROBIC AND ANAEROBIC Blood Culture results may not be optimal due to an inadequate volume of blood received in culture bottles   Culture   Final    NO GROWTH 2 DAYS Performed at [facility], [address], [place], [place] [address]    Report Status PENDING  Incomplete  Aerobic Culture w Gram Stain (superficial specimen)     Status: None (Preliminary result)   Collection Time: 07/13/24  9:00 AM   Specimen: Foot, [name]; Tissue  Result Value Ref Range Status   Specimen Description   Final    TISSUE Performed at [facility], [address] [place], [place] [address]    Special Requests   Final    [name], [name] FOOT, RAY Performed at [facility], [address] [place], [place] [address]    Gram Stain NO WBC SEEN NO ORGANISMS SEEN   Final   Culture   Final    CULTURE REINCUBATED FOR BETTER GROWTH Performed at [facility], [address] [place], [place] [address]    Report Status PENDING  Incomplete  Aerobic/Anaerobic Culture w Gram Stain (surgical/deep wound)     Status: None (Preliminary result)   Collection Time: 07/13/24  9:02  AM   Specimen: Bone; Tissue  Result Value  Ref Range Status   Specimen Description   Final    BONE Performed at [facility], [address] [place], [place] [address]    Special Requests   Final    NONE Performed at [facility], [address] [place], [place] [address]    Gram Stain NO WBC SEEN NO ORGANISMS SEEN   Final   Culture   Final    CULTURE REINCUBATED FOR BETTER GROWTH Performed at [facility], [address], [place], [place] [address]    Report Status PENDING  Incomplete     Labs: Basic Metabolic Panel: Recent Labs  Lab 07/11/24 1423 07/11/24 2203 07/12/24 0814 07/13/24 0603 07/14/24 0537  NA 132*  --  136 137 136  K 4.0  --  3.4* 3.2* 3.2*  CL 96*  --  101 101 100  CO2 24  --  25 26 27   GLUCOSE 462*  --  200* 181* 208*  BUN 8  --  7 9 9   CREATININE 0.69 0.59* 0.51* 0.65 0.60*  CALCIUM  8.7*  --  8.6* 8.7* 8.5*   Liver Function Tests: Recent Labs  Lab 07/11/24 1423 07/12/24 0814  AST 28 24  ALT 23 24  ALKPHOS 100 90  BILITOT 0.3 0.5  PROT 7.0 6.8  ALBUMIN 3.6 3.4*   No results for input(s): LIPASE, AMYLASE in the last 168 hours. No results for input(s): AMMONIA in the last 168 hours. CBC: Recent Labs  Lab 07/09/24 1623 07/11/24 1423 07/11/24 2203 07/12/24 0814  WBC 11.1* 9.2 8.5 8.2  NEUTROABS 7.1 6.2  --   --   HGB 13.8 13.9 13.8 14.0  HCT 39.6 41.8 40.3 40.6  MCV 77.2* 80.1 79.0* 78.1*  PLT 324 328 302 325   Cardiac Enzymes: No results for input(s): CKTOTAL, CKMB, CKMBINDEX, TROPONINI in the last 168 hours. BNP: BNP (last 3 results) No results for input(s): BNP in the last 8760 hours.  ProBNP (last 3 results) No results for input(s): PROBNP in the last 8760 hours.  CBG: Recent Labs  Lab 07/13/24 1506 07/13/24 2058 07/14/24 0045 07/14/24 0435 07/14/24 0751  GLUCAP 293* 245* 181* 171* 167*       Signed:  Devaughn KATHEE Ban MD.  [facility] 07/14/2024, 9:14 AM     [1]  Allergies Allergen Reactions    Lodine [Etodolac] Swelling

## 2024-07-14 NOTE — Plan of Care (Signed)

## 2024-07-14 NOTE — TOC Transition Note (Signed)
 Transition of Care [facility]) - Discharge Note   Patient Details  Name: [name] MRN: [id number] Date of Birth: January 07, 1971  Transition of Care [facility]) CM/SW Contact:  Alvaro Louder, LCSW Phone Number: 07/14/2024, 1:57 PM   Clinical Narrative:   LCSWA confirmed with MD that patient is stable for discharge. LCSWA notified the patient and they are in agreement with discharge. LCSWA discussed PT recommendation of HH Patient was agreeable. LCSWA reached out to [facility] admissions coordinator an started service for patient. The patient reported that he would have a friend come pick him up at discharge. LCSWA discussed PT recommendation  of Bariatric RW and BSC 3in1. Patient was agreeable, LCSWA reached out to [employer] coordinator to set up equipment delivery.  TOC signing off  Final next level of care: Home w Home Health Services Barriers to Discharge: No Barriers Identified   Patient Goals and CMS Choice            Discharge Placement              Patient chooses bed at:  (Home) Patient to be transferred to facility by: Family Name of family member notified: Self Patient and family notified of of transfer: 07/14/24  Discharge Plan and Services Additional resources added to the After Visit Summary for                  DME Arranged: Walker rolling, Bedside commode DME Agency: [employer] Date DME Agency Contacted: 07/14/24   Representative spoke with at DME Agency: [facility] HH Arranged: PT, OT HH Agency: [facility] Date HH Agency Contacted: 07/14/24   Representative spoke with at [facility] Agency: [place]  Social Drivers of Health (SDOH) Interventions SDOH Screenings   Food Insecurity: Patient Declined (07/12/2024)  Housing: Patient Declined (07/12/2024)  Transportation Needs: Patient Declined (07/12/2024)  Utilities: Patient Declined (07/12/2024)  Depression (PHQ2-9): Low Risk (05/25/2024)  Financial Resource Strain: Low Risk (12/03/2022)   Physical Activity: Sufficiently Active (12/03/2022)  Social Connections: Unknown (12/03/2022)  Stress: No Stress Concern Present (12/03/2022)  Tobacco Use: Low Risk (07/11/2024)     Readmission Risk Interventions     No data to display

## 2024-07-14 NOTE — Discharge Instructions (Signed)
 Check fasting sugar daily. Increase nighttime insulin  by 1 unit every night until fasting glucose is consistently less than 200.

## 2024-07-14 NOTE — Progress Notes (Signed)
"  °  Subjective:  Patient ID: [name], male    DOB: Jul 14, 1971,  MRN: [id number]  Chief Complaint  Patient presents with   Wound Check    DOS: 07/13/24 Procedure: 1.  Partial fifth ray resection, right foot   53 y.o. male seen for post op check. He reports some mild pain, dressed to go home in a while, discussed follow up plans and aftercare all questions answered.   Review of Systems: Negative except as noted in the HPI. Denies N/V/F/Ch.   Objective:   Constitutional Well developed. Well nourished.  Vascular Foot warm and well perfused. Capillary refill normal to all digits.   No calf pain with palpation  Neurologic Normal speech. Oriented to person, place, and time. Epicritic sensation diminished to surgical site  Dermatologic Dressing C/D/I R foot  Orthopedic: S/p partial 5th ray amputation   Radiographs: Interval transmetatarsal amputation of the fifth ray.   Pathology: pending  Micro:   FEW GROUP B STREP(S.AGALACTIAE)ISOLATED  Tissue cx: p  Assessment:   1. Osteomyelitis of right foot, unspecified type (HCC)   2. Type 2 diabetes mellitus without complication, without long-term current use of insulin  (HCC)   3. 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  POD # 1 s/p R partial fifth ray amputation, closed  -Progressing well post op, dressing intact, no bleeding, pain controlled.  - Reinforced need for NWB to right foot for improved healing -XR: expected chagnes -WB Status: NWB post op shoe RLE -Sutures: remain intact 2-3 weeks. -Medications/ABX: 2 weeks augmentin  - targeting from cx from urgent care with PROVIDENCIA RETTGERI sensitive to augmentin  -Dressing: remain intact until follow up - F/u Plan: F/u next week Monday in [place] office , office to call to arrange, dressing intact until then, dc home planned for today        [name], DPM [facility]  "

## 2024-07-15 ENCOUNTER — Telehealth: Payer: Self-pay

## 2024-07-15 LAB — SURGICAL PATHOLOGY

## 2024-07-15 NOTE — Telephone Encounter (Signed)
 Please give verbal order for physical therapy.   Luke Shade, MD

## 2024-07-15 NOTE — Transitions of Care (Post Inpatient/ED Visit) (Signed)
" ° °  07/15/2024  Name: [name] MRN: [id number] DOB: 1971-04-29  Today's TOC FU Call Status: Today's TOC FU Call Status:: Unsuccessful Call (1st Attempt) Unsuccessful Call (1st Attempt) Date: 07/15/24  Attempted to reach the patient regarding the most recent Inpatient/ED visit.  Follow Up Plan: Additional outreach attempts will be made to reach the patient to complete the Transitions of Care (Post Inpatient/ED visit) call.   Arvin Seip RN, BSN, CCM [employer], Population Health Case Manager Phone: [phone]  "

## 2024-07-15 NOTE — Telephone Encounter (Signed)
 Copied from CRM [id number]. Topic: Clinical - Home Health Verbal Orders >> Jul 15, 2024  9:30 AM Rea BROCKS wrote: Caller/Agency: [facility]  Callback Number: [phone], secure vm  Service Requested: Physical Therapy  Frequency: 1x week for six weeks  Any new concerns about the patient? no

## 2024-07-15 NOTE — Telephone Encounter (Signed)
 Left a detailed message providing verbal OK per Dr Abbey for patient. Advised to give our office a call back if she has any questions or concerns, regarding the message.

## 2024-07-16 LAB — AEROBIC CULTURE W GRAM STAIN (SUPERFICIAL SPECIMEN): Gram Stain: NONE SEEN

## 2024-07-17 ENCOUNTER — Ambulatory Visit: Payer: Self-pay

## 2024-07-17 DIAGNOSIS — M86171 Other acute osteomyelitis, right ankle and foot: Secondary | ICD-10-CM

## 2024-07-17 LAB — CULTURE, BLOOD (ROUTINE X 2)
Culture: NO GROWTH
Culture: NO GROWTH
Special Requests: ADEQUATE

## 2024-07-17 MED ORDER — LEVOFLOXACIN 500 MG PO TABS: 500.0000 mg | ORAL_TABLET | Freq: Every day | ORAL | 0 refills | Status: AC

## 2024-07-17 NOTE — Progress Notes (Signed)
 Please reach out to the patient to let him know the culture and sensitivity from his toe surgery shows growth of different bacteria needing adjustment to his antibiotic regimen. Please recommend he stops taking Augmentin  (amoxicillin -clavulanate), start Levofloxacin  500 mg daily for 1 week and continue Doxycycline  that was prescribed in the pharmacy. I have sent the Levofloxacin  prescription to the pharmacy. I have discussed this with podiatrist Dr. Melynda Blanch that he is seeing on 07/21/24.   Thank you,  Luke Shade, MD   ------------- 1. Other acute osteomyelitis of right foot (HCC) (Primary) - levofloxacin  (LEVAQUIN ) 500 MG tablet; Take 1 tablet (500 mg total) by mouth daily for 7 days.  Dispense: 7 tablet; Refill: 0  Luke Shade, MD

## 2024-07-18 LAB — AEROBIC/ANAEROBIC CULTURE W GRAM STAIN (SURGICAL/DEEP WOUND): Gram Stain: NONE SEEN

## 2024-07-20 ENCOUNTER — Telehealth: Payer: Self-pay

## 2024-07-20 ENCOUNTER — Telehealth: Payer: Self-pay | Admitting: Podiatry

## 2024-07-20 ENCOUNTER — Other Ambulatory Visit: Payer: Self-pay

## 2024-07-20 NOTE — Transitions of Care (Post Inpatient/ED Visit) (Signed)
 "  07/20/2024  Name: [name] MRN: [id number] DOB: 02-02-1971  Today's TOC FU Call Status: Today's TOC FU Call Status:: Successful TOC FU Call Completed TOC FU Call Complete Date: 07/20/24  Patient's Name and Date of Birth confirmed. 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 How have you been since you were released from the hospital?: Better Any questions or concerns?: No  Items Reviewed: Did you receive and understand the discharge instructions provided?: Yes Medications obtained,verified, and reconciled?: Yes (Medications Reviewed) Any new allergies since your discharge?: No Dietary orders reviewed?: Yes Type of Diet Ordered:: DM diet Do you have support at home?: Yes People in Home [RPT]: spouse Name of Support/Comfort Primary Source: wife  Medications Reviewed Today: Medications Reviewed Today     Reviewed by Rumalda Alan PENNER, RN (Registered Nurse) on 07/20/24 at 1457  Med List Status: <None>   Medication Order Taking? Sig Documenting Provider Last Dose Status Informant  amLODipine  (NORVASC ) 10 MG tablet [id number] Yes Take 1 tablet (10 mg total) by mouth daily. Bair, Kalpana, MD  Active Self  carvedilol  (COREG ) 6.25 MG tablet [id number] Yes Take 1 tablet (6.25 mg total) by mouth 2 (two) times daily with a meal. Wouk, Devaughn Sayres, MD  Active   doxycycline  (VIBRA -TABS) 100 MG tablet [id number] Yes Take 1 tablet (100 mg total) by mouth every 12 (twelve) hours for 13 days. Wouk, Devaughn Sayres, MD  Active   insulin  glargine (LANTUS ) 100 UNIT/ML Solostar Pen [id number]  Inject 10 Units into the skin daily. Wouk, Devaughn Sayres, MD  Active   Insulin  Pen Needle (PEN NEEDLES) 32G X 4 MM MISC [id number] Yes 1 each by Does not apply route at bedtime. Wouk,  Devaughn Sayres, MD  Active   levofloxacin  (LEVAQUIN ) 500 MG tablet [id number] Yes Take 1 tablet (500 mg total) by mouth daily for 7 days. Bair, Kalpana, MD  Active   lisinopril  (ZESTRIL ) 40 MG tablet [id number] Yes Take 1 tablet (40 mg total) by mouth daily. Abbey Bruckner, MD  Active Self  oxyCODONE  (OXY IR/ROXICODONE ) 5 MG immediate release tablet [id number]  Take 1 tablet (5 mg total) by mouth every 6 (six) hours as needed for moderate pain (pain score 4-6) or severe pain (pain score 7-10).  Patient not taking: Reported on 07/20/2024   Kandis Devaughn Sayres, MD  Active   rosuvastatin  (CRESTOR ) 20 MG tablet [id number] Yes Take 1 tablet (20 mg total) by mouth daily. Bair, Bruckner, MD  Active Self  spironolactone  (ALDACTONE ) 50 MG tablet [id number] Yes Take 1 tablet (50 mg total) by mouth daily. Wouk, Devaughn Sayres, MD  Active   tirzepatide  (MOUNJARO ) 7.5 MG/0.5ML Pen [id number] Yes Inject 7.5 mg into the skin once a week. Abbey Bruckner, MD  Active Self            Home Care and Equipment/Supplies: Were Home Health Services Ordered?: Yes Name of Home Health Agency:: [facility] Has Agency set up a time to come to your home?: Yes First Home Health Visit Date: 07/17/24 Any new equipment or medical supplies ordered?: No  Functional Questionnaire: Do you need assistance with bathing/showering or dressing?: No Do you need assistance with meal preparation?: No Do you need assistance with eating?: No Do you have difficulty maintaining continence: No Do you need assistance with getting out of bed/getting out of a chair/moving?: No Do you have  difficulty managing or taking your medications?: No  Follow up appointments reviewed: PCP Follow-up appointment confirmed?: Yes Date of PCP follow-up appointment?: 07/21/24 Follow-up Provider: PCP Specialist Hospital Follow-up appointment confirmed?: Yes Date of Specialist follow-up appointment?: 07/20/24 Follow-Up Specialty Provider:: Podiatry Do you need  transportation to your follow-up appointment?: No Do you understand care options if your condition(s) worsen?: Yes-patient verbalized understanding  SDOH Interventions Today    Flowsheet Row Most Recent Value  SDOH Interventions   Food Insecurity Interventions Intervention Not Indicated  Housing Interventions Intervention Not Indicated  Transportation Interventions Intervention Not Indicated  Utilities Interventions Intervention Not Indicated   Today's Vitals   07/20/24 1513  PainSc: 4    Placed call to patient and reviewed reason for call. Patient reports that he is doing well. Reports that he continues to have high CBG levels.  Fasting today of 300.   Reviewed that patient continues to take 10 units of insulin  that was started while in the hospital.  I reviewed with patient that he is supposed to increase his insulin  by 1 unit per day as long as his CBG fasting is more than 200.  Patient reports that he missed this on his instructions.  Reports that he will take 11 units tonight.   Reviewed with patient that he is using his freestyle libre.   Patient denies any hypoglycemic episodes.  Reports that he was unaware of the ulcer on his foot.  States that he had odor on socks and shoes but did not know he had an ulcer.  Reports when he discovered he had an ulcer that he went to the urgent care.  Reviewed change of antibiotics today due to blood culture report. Reviewed most recent A1c that is very high.  Reviewed complications from uncontrolled DM like blindness, kidney failure, skin ulcers, neuropathy and CV disease.  Reviewed with patient about the importance of DM diet. Reviewed foods to limit or avoid. Discussed importance of increased protein to aid in healing. Reviewed s/s of infection and when to call MD. Reviewed use of rollator and knee scooter.  Reviewed importance of following instructions per MD.  Patient has follow up planned with podiatry and PCP scheduled for tomorrow. Denies  any transportation concerns.   Reviewed and offered 30 day TOC program and patient declined.  Reviewed again the importance of good DM control and inspection of feet daily. Patient voiced understanding.   Alan Ee, RN, BSN, CEN Population Health- Transition of Care Team.  [facility] [phone]  "

## 2024-07-20 NOTE — Telephone Encounter (Unsigned)
 Copied from CRM [id number]. Topic: Clinical - Medication Refill >> Jul 20, 2024 10:56 AM Thersia C wrote: Medication: oxyCODONE  (OXY IR/ROXICODONE ) 5 MG immediate release tablet  Has the patient contacted their pharmacy? Yes (Agent: If no, request that the patient contact the pharmacy for the refill. If patient does not wish to contact the pharmacy document the reason why and proceed with request.) (Agent: If yes, when and what did the pharmacy advise?)  This is the patient's preferred pharmacy:  [facility] [address] ([address]), [place] - [address] [place]) [place] [address] Phone: [phone] Fax: [phone]    Is this the correct pharmacy for this prescription? Yes If no, delete pharmacy and type the correct one.   Has the prescription been filled recently? No  Is the patient out of the medication? Yes  Has the patient been seen for an appointment in the last year OR does the patient have an upcoming appointment? Yes  Can we respond through MyChart? Yes  Agent: Please be advised that Rx refills may take up to 3 business days. We ask that you follow-up with your pharmacy.

## 2024-07-20 NOTE — Telephone Encounter (Signed)
 Pt left mess. He has appt on 07/21/24. I will follow up after visit.  I have not recd any forms????

## 2024-07-21 ENCOUNTER — Encounter: Payer: Self-pay | Admitting: Internal Medicine

## 2024-07-21 ENCOUNTER — Ambulatory Visit (INDEPENDENT_AMBULATORY_CARE_PROVIDER_SITE_OTHER): Admitting: Internal Medicine

## 2024-07-21 ENCOUNTER — Telehealth: Payer: Self-pay

## 2024-07-21 ENCOUNTER — Ambulatory Visit (INDEPENDENT_AMBULATORY_CARE_PROVIDER_SITE_OTHER): Admitting: Podiatry

## 2024-07-21 VITALS — BP 146/84 | HR 93 | Temp 98.9°F | Ht 71.0 in | Wt 320.0 lb

## 2024-07-21 DIAGNOSIS — E1165 Type 2 diabetes mellitus with hyperglycemia: Secondary | ICD-10-CM | POA: Diagnosis not present

## 2024-07-21 DIAGNOSIS — Z9889 Other specified postprocedural states: Secondary | ICD-10-CM

## 2024-07-21 DIAGNOSIS — E876 Hypokalemia: Secondary | ICD-10-CM

## 2024-07-21 DIAGNOSIS — Z09 Encounter for follow-up examination after completed treatment for conditions other than malignant neoplasm: Secondary | ICD-10-CM

## 2024-07-21 DIAGNOSIS — E11622 Type 2 diabetes mellitus with other skin ulcer: Secondary | ICD-10-CM | POA: Diagnosis not present

## 2024-07-21 DIAGNOSIS — M86171 Other acute osteomyelitis, right ankle and foot: Secondary | ICD-10-CM

## 2024-07-21 DIAGNOSIS — G4733 Obstructive sleep apnea (adult) (pediatric): Secondary | ICD-10-CM | POA: Diagnosis not present

## 2024-07-21 DIAGNOSIS — I152 Hypertension secondary to endocrine disorders: Secondary | ICD-10-CM

## 2024-07-21 DIAGNOSIS — Z794 Long term (current) use of insulin: Secondary | ICD-10-CM | POA: Diagnosis not present

## 2024-07-21 DIAGNOSIS — E1159 Type 2 diabetes mellitus with other circulatory complications: Secondary | ICD-10-CM | POA: Diagnosis not present

## 2024-07-21 DIAGNOSIS — E871 Hypo-osmolality and hyponatremia: Secondary | ICD-10-CM | POA: Diagnosis not present

## 2024-07-21 DIAGNOSIS — Z89421 Acquired absence of other right toe(s): Secondary | ICD-10-CM | POA: Diagnosis not present

## 2024-07-21 MED ORDER — FREESTYLE LIBRE 3 PLUS SENSOR MISC: 3 refills | Status: DC

## 2024-07-21 NOTE — Telephone Encounter (Signed)
 Copied from CRM [id number]. Topic: Clinical - Home Health Verbal Orders >> Jul 21, 2024 12:17 PM Alfonso HERO wrote: Caller/Agency: [facility] Callback Number: [phone] Service Requested: Occupational Therapy Frequency: 1x a week for 6 weeks Any new concerns about the patient? No

## 2024-07-21 NOTE — Progress Notes (Unsigned)
 Healing well we will see him back in 2 weeks to take the stitches out partial fifth ray amputation right foot

## 2024-07-21 NOTE — Patient Instructions (Addendum)
 Increase your dose of Lantus   to 20 units  starting today,   and increase your  dose by 5 units every 3 days  until your fastings are <  150 .   Return in one week (can be virtual) for review of blood sugar readings . If no apptointment is available, send me a mychart message to remind me to review your sugars.   I recommend using colace 100 mg or 200 mg at bedtime for the constipation   Increase  your carvedilol  to 2 tablets every 12 hours as a trial   Home sleep study ordered ; the company will contact you to arrange a home visit

## 2024-07-21 NOTE — Progress Notes (Unsigned)
 "  Subjective:  Patient ID: [name], male    DOB: [birth year]  Age: 53 y.o. MRN: [id number]  CC: There were no encounter diagnoses.   HPI [name] presents for No chief complaint on file.  Patient arrived 12 minutes late to scheduled HFU appt .  He is a 53 yr old male with type 2 Diabetes Mellitus, uncontrolled (Sept 2025 A1c 11.9) ,  uncontrolled HTN,  and OSA  who was admitted to  [facility] WITH OSTEomyelitis  of right foot due to infected plantar ulcer .  He underwent amputation on Dec 22  and was discharged on Dec 23  Osteomyelitis  of 5th metatarsal head, possibly 5th proximal phalanx. 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 A1c 11s last month. On mounjaro .  During hospitalization insulin  was started  . He has been taking 10 units  daily and sugars are running . Has not lost weight on mounjaro  dose limited to 7.5 mg due to persistent constipation.  Has not tried laxatives or stools softeners ,  does't want to try any due to decrease   Lab Results  Component Value Date   HGBA1C 11.9 (H) 05/25/2024   UNTREATED SLEEP APNEA FOR THE PAST SEVERAL DECADES.  STOPPED USING CPAP SEVERAL DECADES  AGO,  DDDD -   # HTN:  noted to be Uncontrolled and contributing to presenting problem. 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 Ongoing hypokalemia, likely hyperaodosteronism (can't evaluate as already on spironolactone ).  - cont home lisinopril  40, amlod 10. Hospitalist  increased home spironolactone   from 12.5 to 50 mg daily and - started coreg  6.25 bid    Hoem readings  have a been ballpark  150 systolic,  60 to 80   diastolic    Pcp f/u 2 weeks needs bmp then, attention to BP and glucose control Podiatry f/u 1 week   Outpatient Medications Prior to Visit  Medication Sig Dispense Refill   amLODipine  (NORVASC ) 10 MG tablet Take 1 tablet (10 mg total) by mouth daily. 90 tablet 3   carvedilol  (COREG ) 6.25 MG tablet Take 1 tablet (6.25 mg total) by mouth 2 (two) times daily with a meal. 120 tablet 0   doxycycline  (VIBRA -TABS) 100 MG tablet Take 1 tablet (100 mg total) by mouth every 12 (twelve) hours for 13 days. 26 tablet 0   insulin  glargine (LANTUS ) 100 UNIT/ML Solostar Pen Inject 10 Units into the skin daily. 15 mL 11   Insulin  Pen Needle (PEN NEEDLES) 32G X 4 MM MISC 1 each by Does not apply route at bedtime. 100 each 1   levofloxacin  (LEVAQUIN ) 500 MG tablet Take 1 tablet (500 mg total) by mouth daily for 7 days. 7 tablet 0   lisinopril  (ZESTRIL ) 40 MG tablet Take 1 tablet (40 mg total) by mouth daily. 90 tablet 3   oxyCODONE  (OXY IR/ROXICODONE ) 5 MG immediate release tablet Take 1 tablet (5 mg total) by mouth every 6 (six) hours as needed for moderate pain (pain score 4-6) or severe pain (pain score 7-10). (Patient not taking: Reported on 07/20/2024) 20 tablet 0   rosuvastatin  (CRESTOR ) 20 MG tablet Take 1 tablet (20  mg total) by mouth daily. 90 tablet 3   spironolactone  (ALDACTONE ) 50 MG tablet Take 1 tablet (50 mg total) by mouth daily. 90 tablet 0   tirzepatide  (MOUNJARO ) 7.5 MG/0.5ML Pen Inject 7.5 mg into the skin once a week. 6 mL 1   No facility-administered medications prior to visit.    Review of Systems;  Patient denies headache, fevers, malaise, unintentional weight loss, skin rash, eye pain, sinus congestion and sinus pain, sore throat, dysphagia,  hemoptysis , cough, dyspnea, wheezing, chest pain, palpitations, orthopnea, edema, abdominal pain, nausea, melena, diarrhea, constipation, flank pain, dysuria, hematuria, urinary  Frequency, nocturia, numbness, tingling, seizures,  Focal  weakness, Loss of consciousness,  Tremor, insomnia, depression, anxiety, and suicidal ideation.      Objective:  There were no vitals taken for this visit.  BP Readings from Last 3 Encounters:  07/14/24 (!) 154/87  07/09/24 (!) 185/80  05/25/24 (!) 140/80    Wt Readings from Last 3 Encounters:  07/13/24 (!) 320 lb (145.2 kg)  07/09/24 (!) 325 lb 4.8 oz (147.6 kg)  05/25/24 (!) 317 lb 9.6 oz (144.1 kg)    Physical Exam  Lab Results  Component Value Date   HGBA1C 11.9 (H) 05/25/2024   HGBA1C 9.3 (A) 10/21/2023   HGBA1C 12.8 (H) 06/10/2023    Lab Results  Component Value Date   CREATININE 0.60 (L) 07/14/2024   CREATININE 0.65 07/13/2024   CREATININE 0.51 (L) 07/12/2024    Lab Results  Component Value Date   WBC 8.2 07/12/2024   HGB 14.0 07/12/2024   HCT 40.6 07/12/2024   PLT 325 07/12/2024   GLUCOSE 208 (H) 07/14/2024   CHOL 198 12/14/2022   TRIG 246.0 (H) 12/14/2022   HDL 36.30 (L) 12/14/2022   LDLDIRECT 122.0 12/14/2022   ALT 24 07/12/2024   AST 24 07/12/2024   NA 136 07/14/2024   K 3.2 (L) 07/14/2024   CL 100 07/14/2024   CREATININE 0.60 (L) 07/14/2024   BUN 9 07/14/2024   CO2 27 07/14/2024   TSH 2.55 12/14/2022   PSA 0.24 12/14/2022   INR 0.9 05/13/2024   HGBA1C 11.9 (H) 05/25/2024   MICROALBUR 142.7 (H) 05/25/2024    MR FOOT RIGHT WO CONTRAST Result Date: 07/13/2024 CLINICAL DATA:  Right plantar foot wound.  History of diabetes. EXAM: MRI OF THE RIGHT FOREFOOT WITHOUT CONTRAST TECHNIQUE: Multiplanar, multisequence MR imaging of the right foot was performed. No intravenous contrast was administered. COMPARISON:  Right foot radiographs dated 07/09/2024. FINDINGS: Soft tissue/Bones/Joint/Cartilage Soft tissue ulceration of the plantar forefoot, overlying the level of the fifth metatarsal head and fifth MTP joint. There is T2 hyperintense marrow signal abnormality with corresponding T1 hypointensity of the fifth metatarsal head and T2 hyperintense marrow  signal abnormality with less pronounced mild T1 hypointensity of the base of the fifth proximal phalanx with a fifth MTP joint effusion. These findings are most compatible with osteomyelitis/septic arthritis. No acute fracture or dislocation. Mild degenerative changes of the first MTP joint and midfoot. Ligaments Collateral ligaments are intact.  Lisfranc ligament is intact. Muscles and Tendons Flexor and extensor compartment tendons are intact. Mild atrophy of the intrinsic foot musculature with increased T2 hyperintense signal, likely reflecting chronic denervation changes. Soft tissue No loculated fluid collection. Subcutaneous edema extending along the dorsal foot. IMPRESSION: Findings most compatible with osteomyelitis/septic arthritis of the fifth MTP joint involving the fifth metatarsal head and the base of the fifth proximal phalanx. Overlying soft tissue ulceration of the plantar lateral forefoot. No  loculated fluid collection. Electronically Signed   By: Harrietta Sherry M.D.   On: 07/13/2024 08:27    Assessment & Plan:  .There are no diagnoses linked to this encounter.   I spent 34 minutes on the day of this face to face encounter reviewing patient's  most recent visit with cardiology,  nephrology,  and neurology,  prior relevant surgical and non surgical procedures, recent  labs and imaging studies, counseling on weight management,  reviewing the assessment and plan with patient, and post visit ordering and reviewing of  diagnostics and therapeutics with patient  .   Follow-up: No follow-ups on file.   Verneita LITTIE Kettering, MD "

## 2024-07-22 DIAGNOSIS — Z09 Encounter for follow-up examination after completed treatment for conditions other than malignant neoplasm: Secondary | ICD-10-CM | POA: Insufficient documentation

## 2024-07-22 DIAGNOSIS — Z89421 Acquired absence of other right toe(s): Secondary | ICD-10-CM | POA: Insufficient documentation

## 2024-07-22 LAB — BASIC METABOLIC PANEL WITH GFR
BUN: 25 mg/dL — ABNORMAL HIGH (ref 6–23)
CO2: 25 meq/L (ref 19–32)
Calcium: 9.5 mg/dL (ref 8.4–10.5)
Chloride: 97 meq/L (ref 96–112)
Creatinine, Ser: 0.86 mg/dL (ref 0.40–1.50)
GFR: 97.45 mL/min
Glucose, Bld: 282 mg/dL — ABNORMAL HIGH (ref 70–99)
Potassium: 4.6 meq/L (ref 3.5–5.1)
Sodium: 132 meq/L — ABNORMAL LOW (ref 135–145)

## 2024-07-22 NOTE — Assessment & Plan Note (Signed)
 Complicated by nephropathy , obesity and neuropathy.  Lantus  10 units started during [facility] December hospitalization.  Dose increased today to 20 units and advised to increase dose every 3 days by 5 untis until fasting sugars are < 150.  SABRA  CBG monitor placed ; return In 2 weeks to review glycemic control  Lab Results  Component Value Date   HGBA1C 11.9 (H) 05/25/2024   Lab Results  Component Value Date   MICROALBUR 142.7 (H) 05/25/2024

## 2024-07-22 NOTE — Assessment & Plan Note (Addendum)
 5th MT due to nonhealing diabetic ulcer . Treated with amputation of 5th NT ray Dec 22 [facility]  Dressing is intact.  Contiue abx, offloading and follow up with podiatry

## 2024-07-22 NOTE — Assessment & Plan Note (Signed)
Patient is stable post discharge and has no new issues or questions about discharge plans at the visit today for hospital follow up.  I have reviewed the records from the hospital admission in detail with patient today. 

## 2024-07-22 NOTE — Assessment & Plan Note (Signed)
 Untreated due to mask intolerance. .reviewed the long term complications of untreated OSA and recommended repeat testing

## 2024-07-22 NOTE — Telephone Encounter (Signed)
 Left detailed message for [facility] providing the verbal OK for orders. Advised to give our office a call back to discuss if any questions regarding the message.

## 2024-07-22 NOTE — Assessment & Plan Note (Addendum)
 Secondary to osteomyelitis o 5th MT due to nonhealing diabetic ulcer .  Dressing is intact.  Contiue abx, offloading and follow up with podiatry

## 2024-07-22 NOTE — Assessment & Plan Note (Signed)
 Prognosis for significant weight loss is poor given his refusal to uptitrated GLO 1 agonist due to  constipation which he is unwilling to treat

## 2024-07-22 NOTE — Assessment & Plan Note (Addendum)
 Uncontrolled, likely due to untreated OSA.  Increase carvedilol  dose  which was started in house  at 6.25 mg bid.  Increased  dose today to 12.5 mg bid

## 2024-07-23 ENCOUNTER — Ambulatory Visit: Payer: Self-pay | Admitting: Internal Medicine

## 2024-07-24 MED ORDER — OXYCODONE HCL 5 MG PO TABS: 5.0000 mg | ORAL_TABLET | Freq: Four times a day (QID) | ORAL | 0 refills | Status: DC | PRN

## 2024-07-27 ENCOUNTER — Telehealth: Payer: Self-pay

## 2024-07-27 ENCOUNTER — Telehealth: Payer: Self-pay | Admitting: Podiatry

## 2024-07-27 NOTE — Telephone Encounter (Signed)
 Copied from CRM [id number]. Topic: Clinical - Medical Advice >> Jul 27, 2024  1:50 PM Thersia C wrote: Reason for CRM: Patient called in stated he has been taking  doxycycline  (VIBRA -TABS) 100 MG tablet   does he needs a refill or stay with what he has , also has some questions regarding his sugar levels

## 2024-07-27 NOTE — Telephone Encounter (Signed)
 pt lft mess-cld bk and he said he is filing a claim with [employer] and they may send forms to us . He has not signed the Auth for release yet-can't pull up on his ph. He will cll me if he needs us 

## 2024-07-28 ENCOUNTER — Inpatient Hospital Stay: Admitting: Family

## 2024-07-28 NOTE — Telephone Encounter (Signed)
 Continue current medications, bring home blood glucose reading for review during his next visit please.   Luke Shade, MD

## 2024-07-28 NOTE — Telephone Encounter (Signed)
 Spoke with patient to make him aware of Dr Graylon recommendations. Patient verbalized understanding and states he has completed all of his antibiotics. and he states his blood sugars levels are between 170 and 200. Patient states he has not noticed any lower than the 170. Patient declines having any side effects from the sugar level readings. Patient is wondering if he should continue doing the 20 units of insulin . Please advise?

## 2024-07-28 NOTE — Telephone Encounter (Signed)
 Patient notified of Dr Graylon recommendations. Patient verbalized understanding and has no further questions at this time.

## 2024-07-28 NOTE — Telephone Encounter (Signed)
 No refill on Doxycycline . Can we get more information on what questions he has about his blood glucose. We can discuss this during his upcoming visit on 08/05/24 if not urgent.   Thank you,  Luke Shade, MD

## 2024-08-03 ENCOUNTER — Other Ambulatory Visit: Payer: Self-pay

## 2024-08-03 ENCOUNTER — Telehealth: Payer: Self-pay

## 2024-08-03 MED ORDER — CARVEDILOL 6.25 MG PO TABS: 6.2500 mg | ORAL_TABLET | Freq: Two times a day (BID) | ORAL | 0 refills | Status: DC

## 2024-08-03 MED ORDER — INSULIN GLARGINE 100 UNIT/ML SOLOSTAR PEN: 10.0000 [IU] | PEN_INJECTOR | Freq: Every day | SUBCUTANEOUS | 11 refills | Status: DC

## 2024-08-03 NOTE — Telephone Encounter (Signed)
 Copied from CRM #[id number]. Topic: Clinical - Medication Refill >> Aug 03, 2024 12:02 PM Alexandria E wrote: Medication: insulin  glargine (LANTUS ) 100 UNIT/ML Solostar Pen carvedilol  (COREG ) 6.25 MG tablet  *Patient stated these were last filled by a hospital provider, but questioning if PCP can fill. He has appointment with PCP on 08/05/2024.  Has the patient contacted their pharmacy? No (Agent: If no, request that the patient contact the pharmacy for the refill. If patient does not wish to contact the pharmacy document the reason why and proceed with request.) (Agent: If yes, when and what did the pharmacy advise?)  This is the patient's preferred pharmacy:  [facility] [address] ([address]), [place] - [address] [place]) [place] [address] Phone: [phone] Fax: [phone]   Is this the correct pharmacy for this prescription? Yes If no, delete pharmacy and type the correct one.   Has the prescription been filled recently? Yes  Is the patient out of the medication? Yes  Has the patient been seen for an appointment in the last year OR does the patient have an upcoming appointment? Yes  Can we respond through MyChart? Yes  Agent: Please be advised that Rx refills may take up to 3 business days. We ask that you follow-up with your pharmacy.

## 2024-08-03 NOTE — Telephone Encounter (Signed)
 Lm and sent MyChart message:  Your appontment on1/14/2026 has a time change to 10:30 am. If you are unable to come at this time, please calle the office to reschedule your appointment.

## 2024-08-04 ENCOUNTER — Ambulatory Visit (INDEPENDENT_AMBULATORY_CARE_PROVIDER_SITE_OTHER): Admitting: Podiatry

## 2024-08-04 DIAGNOSIS — Z9889 Other specified postprocedural states: Secondary | ICD-10-CM

## 2024-08-04 NOTE — Progress Notes (Signed)
 "  Subjective:  Patient ID: [name], male    DOB: 10-Oct-1970,  MRN: [id number]  Chief Complaint  Patient presents with   Routine Post Op    DOS 07/13/24 right foot amputation       DOS: 07/13/2024 Procedure: Right partial fifth ray amputation  54 y.o. male returns for post-op check.  He states is doing okay.  Pain is controlled.  Bandages clean dry and intact has been partial weightbearing to the heel.  No nausea fever chills vomiting  Review of Systems: Negative except as noted in the HPI. Denies N/V/F/Ch.  Past Medical History:  Diagnosis Date   COPD (chronic obstructive pulmonary disease) (HCC)    I had COPD like 10 years ago   Diabetes mellitus without complication (HCC)    Elevated d-dimer 08/26/2023   Elevated troponin 08/26/2023   Generalized weakness 08/25/2023   Hypertension    Hypertensive urgency 01/11/2013   Obesity    Current Medications[1]  Tobacco Use History[2]  Allergies[3] Objective:  There were no vitals filed for this visit. There is no height or weight on file to calculate BMI. Constitutional Well developed. Well nourished.  Vascular Foot warm and well perfused. Capillary refill normal to all digits.   Neurologic Normal speech. Oriented to person, place, and time. Epicritic sensation to light touch grossly present bilaterally.  Dermatologic Skin mostly well coapted and reepithelialized.  Superficial dehiscence noted lateral side of the fourth metatarsophalangeal joint.  No deep wound noted no signs of infection noted  Orthopedic: No further tenderness to palpation noted about the surgical site.   Radiographs: None Assessment:   1. S/P foot surgery    Plan:  Patient was evaluated and treated and all questions answered.  S/p foot surgery right -Progressing as expected post-operatively. -XR: None -WB Status: Partial weightbearing to the heel in surgical shoe -Sutures: Removed.  No clinical signs of dehiscence or no  complication noted. -Medications: None - Superficial dehiscence noted.  Encourage Betadine wet-to-dry dressing for next 3 weeks imagine it should heal by then.  It is very superficial  No follow-ups on file.      [1]  Current Outpatient Medications:    amLODipine  (NORVASC ) 10 MG tablet, Take 1 tablet (10 mg total) by mouth daily., Disp: 90 tablet, Rfl: 3   carvedilol  (COREG ) 6.25 MG tablet, Take 1 tablet (6.25 mg total) by mouth 2 (two) times daily with a meal., Disp: 120 tablet, Rfl: 0   Continuous Glucose Sensor (FREESTYLE LIBRE 3 PLUS SENSOR) MISC, Change sensor every 15 days., Disp: 6 each, Rfl: 3   insulin  glargine (LANTUS ) 100 UNIT/ML Solostar Pen, Inject 10 Units into the skin daily., Disp: 15 mL, Rfl: 11   Insulin  Pen Needle (PEN NEEDLES) 32G X 4 MM MISC, 1 each by Does not apply route at bedtime., Disp: 100 each, Rfl: 1   lisinopril  (ZESTRIL ) 40 MG tablet, Take 1 tablet (40 mg total) by mouth daily., Disp: 90 tablet, Rfl: 3   oxyCODONE  (OXY IR/ROXICODONE ) 5 MG immediate release tablet, Take 1 tablet (5 mg total) by mouth every 6 (six) hours as needed for moderate pain (pain score 4-6) or severe pain (pain score 7-10)., Disp: 20 tablet, Rfl: 0   rosuvastatin  (CRESTOR ) 20 MG tablet, Take 1 tablet (20 mg total) by mouth daily., Disp: 90 tablet, Rfl: 3   spironolactone  (ALDACTONE ) 50 MG tablet, Take 1 tablet (50 mg total) by mouth daily., Disp: 90 tablet, Rfl: 0   tirzepatide  (MOUNJARO ) 7.5 MG/0.5ML Pen,  Inject 7.5 mg into the skin once a week., Disp: 6 mL, Rfl: 1 [2]  Social History Tobacco Use  Smoking Status Never   Passive exposure: Past  Smokeless Tobacco Never  [3]  Allergies Allergen Reactions   Lodine [Etodolac] Swelling   "

## 2024-08-05 ENCOUNTER — Telehealth: Payer: Self-pay

## 2024-08-05 ENCOUNTER — Inpatient Hospital Stay

## 2024-08-05 MED ORDER — CARVEDILOL 12.5 MG PO TABS: 12.5000 mg | ORAL_TABLET | Freq: Two times a day (BID) | ORAL | 0 refills | Status: DC

## 2024-08-05 MED ORDER — INSULIN GLARGINE 100 UNIT/ML SOLOSTAR PEN: 20.0000 [IU] | PEN_INJECTOR | Freq: Every day | SUBCUTANEOUS | 0 refills | Status: DC

## 2024-08-05 NOTE — Telephone Encounter (Signed)
 Attempted to reach out to [facility] RN Case Manager at the provided contact information with extension number. Received an error message stating that is not an active extension. Unable to reach her. Attempted to reach out to the patient to follow up with which medications, he is out of currently. Unable to reach patient due to an error message stating call cannot be completed as dialed. Will attempted to reach patient again later.  Reached out to patient's [facility] pharmacy to check status of medications. Pharmacy technician states she ran the medications and insurance is confirming for the medication to be picked up tomorrow at the earliest.

## 2024-08-05 NOTE — Telephone Encounter (Unsigned)
 Copied from CRM [id number]. Topic: Clinical - Medication Question >> Aug 05, 2024  4:04 PM Viola FALCON wrote: RN Case Manger Bari called to follow up on medication question from earlier - the following medications need new scripts asap with MG/dosages updated  The Lantus  needs to be 20 units   And the Carvedilol  (COREG ) 6.25 MG tablet is suppose to be 12.5mg   1 in the morning and one at night    He's been out of medications since Monday and needs these sent to the [facility] pharmacy on file today.  Bari says his blood sugar was 209 this morning.   Please let her know when this is done. You can call her at [phone] ext [phone] (previous crm had incorrect extension)

## 2024-08-05 NOTE — Telephone Encounter (Signed)
 Copied from CRM [id number]. Topic: Clinical - Medication Question >> Aug 05, 2024 11:31 AM Thersia BROCKS wrote: Reason for CRM: Bari RN Case Manager [facility] called in regarding patients medications, patient is completely out and does not have any medication hasn't took it since Monday states she needs a call back and needs someone to fill these medications as soon as possible, need new prescription and dosage    [phone] ext [phone] confidential voicemail

## 2024-08-05 NOTE — Telephone Encounter (Signed)
 Copied from CRM #[id number]. Topic: Clinical - Medication Refill >> Aug 03, 2024 12:02 PM Alexandria E wrote: Medication: insulin  glargine (LANTUS ) 100 UNIT/ML Solostar Pen carvedilol  (COREG ) 6.25 MG tablet  *Patient stated these were last filled by a hospital provider, but questioning if PCP can fill. He has appointment with PCP on 08/05/2024.  Has the patient contacted their pharmacy? No (Agent: If no, request that the patient contact the pharmacy for the refill. If patient does not wish to contact the pharmacy document the reason why and proceed with request.) (Agent: If yes, when and what did the pharmacy advise?)  This is the patient's preferred pharmacy:  [facility] [address]), [place] - [address] [place]) [place] [address] Phone: [phone] Fax: [phone]   Is this the correct pharmacy for this prescription? Yes If no, delete pharmacy and type the correct one.   Has the prescription been filled recently? Yes  Is the patient out of the medication? Yes  Has the patient been seen for an appointment in the last year OR does the patient have an upcoming appointment? Yes  Can we respond through MyChart? Yes  Agent: Please be advised that Rx refills may take up to 3 business days. We ask that you follow-up with your pharmacy. >> Aug 04, 2024  4:48 PM Hadassah PARAS wrote: Izetta from [employer] called in to f/u on medication req. She was being advised by pt that this medication was being denied by pharm and not availble until 1/15.  Pt was req for an override from pcp. Advised the medication has already been approved and sent to pharm

## 2024-08-05 NOTE — Addendum Note (Signed)
 Addended by: MARYLYNN VERNEITA CROME on: 08/05/2024 05:12 PM   Modules accepted: Orders

## 2024-08-06 ENCOUNTER — Ambulatory Visit: Payer: Self-pay

## 2024-08-06 ENCOUNTER — Telehealth: Payer: Self-pay | Admitting: Lab

## 2024-08-06 ENCOUNTER — Telehealth: Payer: Self-pay

## 2024-08-06 ENCOUNTER — Other Ambulatory Visit (HOSPITAL_COMMUNITY): Payer: Self-pay

## 2024-08-06 ENCOUNTER — Other Ambulatory Visit: Payer: Self-pay

## 2024-08-06 DIAGNOSIS — Z794 Long term (current) use of insulin: Secondary | ICD-10-CM

## 2024-08-06 MED ORDER — FREESTYLE LIBRE 3 PLUS SENSOR MISC: 9 refills | Status: AC

## 2024-08-06 NOTE — Telephone Encounter (Unsigned)
 Copied from CRM #[id number]. Topic: Clinical - Medical Advice >> Aug 06, 2024 10:40 AM Tiffini S wrote: Reason for CRM: Debby with [facility] [phone]/ main office number [phone] called that his vitals yesterday for blood sugar was 206 and 216 bp was 166/100- needs medication refills but do not know which ones needs a refill   Please call and follow up with the patient at [phone]

## 2024-08-06 NOTE — Telephone Encounter (Signed)
 Physical therapy is calling regarding weight baring status on patient so she knows how to proceed please advise.

## 2024-08-06 NOTE — Telephone Encounter (Signed)
My chart sent to pt informing

## 2024-08-06 NOTE — Telephone Encounter (Signed)
 FYI Only or Action Required?: FYI only for provider: See note below.  Patient was last seen in primary care on 07/21/2024 by Marylynn Verneita CROME, MD.  Called Nurse Triage reporting Medication Refill.    Triage Disposition: Information or Advice Only Call  Patient/caregiver understands and will follow disposition?: Yes         Copied from CRM #[id number]. Topic: Clinical - Medical Advice >> Aug 06, 2024 10:40 AM Tiffini S wrote: Reason for CRM: Debby with [facility] [phone] main office number [phone] called that his vitals yesterday for blood sugar was 206 and 216 bp was 166/100- needs medication refills but do not know which ones needs a refill    Please call and follow up with the patient at [phone] Reason for Disposition  Health information question, no triage required and triager able to answer question  Answer Assessment - Initial Assessment Questions 1. REASON FOR CALL: What is the main reason for your call? or How can I best help you?    This RN called patient back to discuss medication refill concerns. Patient stated he went to the pharmacy today to pick up his refills on the  Lantus  and Carvedilol . He stated he will take the medication as soon as he gets back home. He also mentioned that his BS have decreased. Pt. Is asymptomatic during call. No further concerns at this time.  Protocols used: Information Only Call - No Triage-A-AH

## 2024-08-06 NOTE — Telephone Encounter (Signed)
 Addendum: Dr. Marylynn not Dr. Debby Luke Shade, MD

## 2024-08-06 NOTE — Telephone Encounter (Signed)
 Dr. Debby sent libre 3+ sensor for 30 days with 9 refills on 08/06/24.

## 2024-08-06 NOTE — Telephone Encounter (Signed)
 Noted

## 2024-08-06 NOTE — Telephone Encounter (Signed)
 Called and spoke with patient to follow up with how is he is doing and feeling with his blood sugars. Patient states his doing fine and his blood sugars stay around 200. Patient states he has been on the phone with [employer] and they are requesting a Prior Authorization from our office for his [employer] Plus because when they ran it, it was over $200. Patient was advised that I would reach out to our PA team and see if they would be able to assist with the co-pay cost. Patient verbalized understanding and has no further questions at this time.   RX team would you be able to initiate a PA for Freestyle Libre Plus in patient's chart? Please advise?

## 2024-08-06 NOTE — Telephone Encounter (Signed)
 Pharmacy Patient Advocate Encounter   Received notification from Physician's Office that prior authorization for Freestyle Libre 3 Plus Sensors is required/requested.   Insurance verification completed.   The patient is insured through [employer].   Per test claim: The current 90 day co-pay is, $224.99.  No PA needed at this time. This test claim was processed through [facility]- copay amounts may vary at other pharmacies due to pharmacy/plan contracts, or as the patient moves through the different stages of their insurance plan.    * 30 day supply/ 2 sensors for 30 is $0 copay*   *60 day supply/ 4 sensors for 60 days is $149.99 copay*

## 2024-08-06 NOTE — Telephone Encounter (Signed)
 Rx has been changed to 2 sensors with 9 refills to be equivalent to Rx sent from Dr Marylynn back on 07/21/24.

## 2024-08-12 ENCOUNTER — Telehealth: Payer: Self-pay

## 2024-08-12 NOTE — Telephone Encounter (Signed)
 Patient has appointment with NP Vincente on 08/13/24. Recommend he brings home glucose reading for review for upcoming appointment.   Luke Shade, MD

## 2024-08-12 NOTE — Telephone Encounter (Signed)
 Copied from CRM [id number]. Topic: Clinical - Medical Advice >> Aug 12, 2024 10:30 AM Sophia H wrote: Reason for CRM: **Message for provider.  Damien Kitty - [facility] PT calling to report a high blood sugar on the patient, fasting was 209. Patient had been out of his insulin   but has it now, stated he took 20 units of lantus .   If any questions please reach out # [phone]

## 2024-08-13 ENCOUNTER — Telehealth: Payer: Self-pay

## 2024-08-13 ENCOUNTER — Encounter: Payer: Self-pay | Admitting: Nurse Practitioner

## 2024-08-13 ENCOUNTER — Ambulatory Visit: Admitting: Nurse Practitioner

## 2024-08-13 VITALS — BP 122/76 | HR 84 | Temp 98.3°F | Ht 71.0 in | Wt 311.6 lb

## 2024-08-13 DIAGNOSIS — Z794 Long term (current) use of insulin: Secondary | ICD-10-CM

## 2024-08-13 DIAGNOSIS — E1165 Type 2 diabetes mellitus with hyperglycemia: Secondary | ICD-10-CM | POA: Diagnosis not present

## 2024-08-13 MED ORDER — LANTUS SOLOSTAR 100 UNIT/ML ~~LOC~~ SOPN: 25.0000 [IU] | PEN_INJECTOR | Freq: Every day | SUBCUTANEOUS | 99 refills | Status: DC

## 2024-08-13 MED ORDER — TIRZEPATIDE 10 MG/0.5ML ~~LOC~~ SOAJ: 10.0000 mg | SUBCUTANEOUS | 2 refills | Status: AC

## 2024-08-13 NOTE — Assessment & Plan Note (Addendum)
 Persistent hyperglycemia. Last Hg A1c 11.9 on 05/25/24. On insulin  22 units  and Mounjaro  7.5 mg . Issues with CGM adherence. No medication side effects reported. Discussed stress management and dietary modifications. - Increased Lantus  to 25 units. Monitor blood glucose and  increase 2 units every 3 days until fasting blood sugar less than 150. - Increased Mounjaro  to 10 mg weekly. Monitor for constipation. - Provided CGM sample. Instructed on proper application with adhesive tape. - Advised on dietary modifications: reduce carbohydrates, use CGM to identify hyperglycemic foods. - Follow-up in two weeks to assess glycemic control and medication adjustments.     Orders:   tirzepatide  (MOUNJARO ) 10 MG/0.5ML Pen; Inject 10 mg into the skin once a week.   insulin  glargine (LANTUS  SOLOSTAR) 100 UNIT/ML Solostar Pen; Inject 25 Units into the skin daily.

## 2024-08-13 NOTE — Progress Notes (Addendum)
 "  Established Patient Office Visit  Subjective:  Patient ID: [name], male    DOB: 1971-07-22  Age: 54 y.o. MRN: [id number]  CC:  Chief Complaint  Patient presents with   Acute Visit    Elevated blood sugar   Discussed the use of AI scribe software for clinical note transcription with the patient, who gave verbal consent to proceed.  History of Present Illness   History of Present Illness   [name] is a 54 year old male with diabetes who presents for a follow-up visit to manage his blood sugar levels.  He manages his diabetes with Lantus   and Mounjaro . He recently increased his Lantus  from 20 units, then to 23 units over the last few days. He takes Mounjaro  7.5 mg weekly since May and has not increased to 10 mg due to constipation when a higher dose was considered.  He has significantly reduced carbohydrates, avoiding bread, potatoes, pasta, and keto breads. He usually eats eggs and sausage for breakfast.   He used a Libre CGM but has had repeated sensor adhesion problems and has already gone through three sensors and out of libre since Aug 04, 2024  Date of Download: 08/13/24 (07/22/24-08/04/24) % Time CGM is active: 94% Average Glucose: 218 mg/dL Glucose Management Indicator: 8.5 % Glucose Variability: 16.7 (goal <36%) Time in Goal:  - Time in range 70-180: 9% - Time above range: 91% - Time below range: 0%   Recent fasting blood sugars over the past week range from 161 to 243. He takes Lantus  22 units at night between 10 PM and 12 AM and sometimes snacks on pistachios or sugar-free candy before bed.     Past Medical History:  Diagnosis Date   COPD (chronic obstructive pulmonary disease) (HCC)    I had COPD like 10 years ago   Diabetes mellitus without complication (HCC)    Elevated d-dimer 08/26/2023   Elevated troponin 08/26/2023   Generalized weakness 08/25/2023   Hypertension    Hypertensive urgency 01/11/2013   Obesity     Past  Surgical History:  Procedure Laterality Date   AMPUTATION Right 07/13/2024   Procedure: AMPUTATION, FOOT, RAY;  Surgeon: Malvin Marsa FALCON, DPM;  Location: [facility] ORS;  Service: Orthopedics/Podiatry;  Laterality: Right;  Right partial 5th ray amputation    History reviewed. No pertinent family history.  Social History   Socioeconomic History   Marital status: Married    Spouse name: Not on file   Number of children: Not on file   Years of education: Not on file   Highest education level: Not on file  Occupational History   Not on file  Tobacco Use   Smoking status: Never    Passive exposure: Past   Smokeless tobacco: Never  Vaping Use   Vaping status: Never Used  Substance and Sexual Activity   Alcohol use: Not Currently   Drug use: Never   Sexual activity: Not on file  Other Topics Concern   Not on file  Social History Narrative   Not on file   Social Drivers of Health   Tobacco Use: Low Risk (08/13/2024)   Patient History    Smoking Tobacco Use: Never    Smokeless Tobacco Use: Never    Passive Exposure: Past  Financial Resource Strain: Low Risk (12/03/2022)   Overall Financial Resource Strain (CARDIA)    Difficulty of Paying Living Expenses: Not very hard  Food Insecurity: No Food Insecurity (07/20/2024)   Epic  Worried About Programme Researcher, Broadcasting/film/video in the Last Year: Never true    Ran Out of Food in the Last Year: Never true  Transportation Needs: No Transportation Needs (07/20/2024)   Epic    Lack of Transportation (Medical): No    Lack of Transportation (Non-Medical): No  Physical Activity: Sufficiently Active (12/03/2022)   Exercise Vital Sign    Days of Exercise per Week: 5 days    Minutes of Exercise per Session: 60 min  Stress: No Stress Concern Present (12/03/2022)   [employer] of Occupational Health - Occupational Stress Questionnaire    Feeling of Stress : Only a little  Social Connections: Unknown (12/03/2022)   Social Connection and  Isolation Panel    Frequency of Communication with Friends and Family: More than three times a week    Frequency of Social Gatherings with Friends and Family: More than three times a week    Attends Religious Services: More than 4 times per year    Active Member of [employer] or Organizations: Not on file    Attends Banker Meetings: Not on file    Marital Status: Married  Intimate Partner Violence: Not At Risk (07/20/2024)   Epic    Fear of Current or Ex-Partner: No    Emotionally Abused: No    Physically Abused: No    Sexually Abused: No  Depression (PHQ2-9): Low Risk (08/13/2024)   Depression (PHQ2-9)    PHQ-2 Score: 0  Alcohol Screen: Not on file  Housing: Unknown (07/20/2024)   Epic    Unable to Pay for Housing in the Last Year: No    Number of Times Moved in the Last Year: Not on file    Homeless in the Last Year: No  Utilities: Not At Risk (07/20/2024)   Epic    Threatened with loss of utilities: No  Health Literacy: Not on file     Outpatient Medications Prior to Visit  Medication Sig Dispense Refill   amLODipine  (NORVASC ) 10 MG tablet Take 1 tablet (10 mg total) by mouth daily. 90 tablet 3   carvedilol  (COREG ) 12.5 MG tablet Take 1 tablet (12.5 mg total) by mouth 2 (two) times daily with a meal. 180 tablet 0   Continuous Glucose Sensor (FREESTYLE LIBRE 3 PLUS SENSOR) MISC Change sensor every 15 days. 2 each 9   insulin  glargine (LANTUS ) 100 UNIT/ML Solostar Pen Inject 20 Units into the skin daily. 15 mL 0   Insulin  Pen Needle (PEN NEEDLES) 32G X 4 MM MISC 1 each by Does not apply route at bedtime. 100 each 1   lisinopril  (ZESTRIL ) 40 MG tablet Take 1 tablet (40 mg total) by mouth daily. 90 tablet 3   oxyCODONE  (OXY IR/ROXICODONE ) 5 MG immediate release tablet Take 1 tablet (5 mg total) by mouth every 6 (six) hours as needed for moderate pain (pain score 4-6) or severe pain (pain score 7-10). 20 tablet 0   rosuvastatin  (CRESTOR ) 20 MG tablet Take 1 tablet (20 mg  total) by mouth daily. 90 tablet 3   spironolactone  (ALDACTONE ) 50 MG tablet Take 1 tablet (50 mg total) by mouth daily. 90 tablet 0   tirzepatide  (MOUNJARO ) 7.5 MG/0.5ML Pen Inject 7.5 mg into the skin once a week. 6 mL 1   No facility-administered medications prior to visit.    Allergies[1]  ROS Review of Systems Negative unless indicated in HPI.    Objective:    Physical Exam Constitutional:      Appearance: Normal appearance.  HENT:     Mouth/Throat:     Mouth: Mucous membranes are moist.  Eyes:     Conjunctiva/sclera: Conjunctivae normal.     Pupils: Pupils are equal, round, and reactive to light.  Cardiovascular:     Rate and Rhythm: Normal rate and regular rhythm.     Pulses: Normal pulses.     Heart sounds: Normal heart sounds.  Pulmonary:     Effort: Pulmonary effort is normal.     Breath sounds: Normal breath sounds.  Skin:    General: Skin is warm.     Findings: No bruising.  Neurological:     General: No focal deficit present.     Mental Status: He is alert and oriented to person, place, and time. Mental status is at baseline.  Psychiatric:        Mood and Affect: Mood normal.        Behavior: Behavior normal.        Thought Content: Thought content normal.     BP 122/76   Pulse 84   Temp 98.3 F (36.8 C)   Ht 5' 11 (1.803 m)   Wt (!) 311 lb 9.6 oz (141.3 kg)   SpO2 97%   BMI 43.46 kg/m  Wt Readings from Last 3 Encounters:  08/13/24 (!) 311 lb 9.6 oz (141.3 kg)  07/21/24 (!) 320 lb (145.2 kg)  07/13/24 (!) 320 lb (145.2 kg)     Health Maintenance  Topic Date Due   OPHTHALMOLOGY EXAM  Never done   Colonoscopy  Never done   Zoster Vaccines- Shingrix (1 of 2) Never done   FOOT EXAM  06/09/2024   Influenza Vaccine  10/20/2024 (Originally 02/21/2024)   Pneumococcal Vaccine: 50+ Years (2 of 2 - PCV) 05/25/2025 (Originally 05/13/2015)   Hepatitis B Vaccines 19-59 Average Risk (1 of 3 - 19+ 3-dose series) 05/25/2025 (Originally 01/01/1988)    Diabetic kidney evaluation - Urine ACR  11/22/2024   HEMOGLOBIN A1C  11/22/2024   Diabetic kidney evaluation - eGFR measurement  07/21/2025   DTaP/Tdap/Td (2 - Td or Tdap) 08/13/2029   HPV VACCINES (No Doses Required) Completed   Hepatitis C Screening  Completed   HIV Screening  Completed   Meningococcal B Vaccine  Aged Out   COVID-19 Vaccine  Discontinued    There are no preventive care reminders to display for this patient.  Lab Results  Component Value Date   TSH 2.55 12/14/2022   Lab Results  Component Value Date   WBC 8.2 07/12/2024   HGB 14.0 07/12/2024   HCT 40.6 07/12/2024   MCV 78.1 (L) 07/12/2024   PLT 325 07/12/2024   Lab Results  Component Value Date   NA 132 (L) 07/21/2024   K 4.6 07/21/2024   CO2 25 07/21/2024   GLUCOSE 282 (H) 07/21/2024   BUN 25 (H) 07/21/2024   CREATININE 0.86 07/21/2024   BILITOT 0.5 07/12/2024   ALKPHOS 90 07/12/2024   AST 24 07/12/2024   ALT 24 07/12/2024   PROT 6.8 07/12/2024   ALBUMIN 3.4 (L) 07/12/2024   CALCIUM  9.5 07/21/2024   ANIONGAP 9 07/14/2024   GFR 97.45 07/21/2024   Lab Results  Component Value Date   CHOL 198 12/14/2022   Lab Results  Component Value Date   HDL 36.30 (L) 12/14/2022   No results found for: [facility] Lab Results  Component Value Date   TRIG 246.0 (H) 12/14/2022   Lab Results  Component Value Date   CHOLHDL 5 12/14/2022  Lab Results  Component Value Date   HGBA1C 11.9 (H) 05/25/2024      Assessment & Plan:   Assessment & Plan Uncontrolled type 2 diabetes mellitus with hyperglycemia, with long-term current use of insulin  (HCC) Persistent hyperglycemia. Last Hg A1c 11.9 on 05/25/24. On insulin  22 units  and Mounjaro  7.5 mg . Issues with CGM adherence. No medication side effects reported. Discussed stress management and dietary modifications. - Increased Lantus  to 25 units. Monitor blood glucose and  increase 2 units every 3 days until fasting blood sugar less than 150. - Increased  Mounjaro  to 10 mg weekly. Monitor for constipation. - Provided CGM sample. Instructed on proper application with adhesive tape. - Advised on dietary modifications: reduce carbohydrates, use CGM to identify hyperglycemic foods. - Follow-up in two weeks to assess glycemic control and medication adjustments.     Orders:   tirzepatide  (MOUNJARO ) 10 MG/0.5ML Pen; Inject 10 mg into the skin once a week.   insulin  glargine (LANTUS  SOLOSTAR) 100 UNIT/ML Solostar Pen; Inject 25 Units into the skin daily.   Assessment & Plan    Follow-up: Return in about 2 weeks (around 08/27/2024) for PCP for f/up to monitor BS numbers.   Patti Shorb, NP     [1]  Allergies Allergen Reactions   Lodine [Etodolac] Swelling   "

## 2024-08-13 NOTE — Telephone Encounter (Signed)
 FYI: Seen pt today, need follow up appointment to monitor BS numbers. He is scheduled to see you on 08/25/24

## 2024-08-13 NOTE — Telephone Encounter (Signed)
 Placed a Freestyle Libre CGM on patient today and gave Charanpreet Kaur's recommendations on how to help keep it from falling off in the first day or two.

## 2024-08-13 NOTE — Telephone Encounter (Signed)
 Pended Amlodipine  & Spironolactone  for your review per Chelsea Aurora.

## 2024-08-13 NOTE — Patient Instructions (Signed)
" °  Today we discussed your diabetes management, including your current medications, dietary habits, and issues with your continuous glucose monitor (CGM). We made some adjustments to your insulin  and Mounjaro  doses and provided guidance on dietary modifications and CGM usage.  YOUR PLAN:  UNCONTROLLED TYPE 2 DIABETES MELLITUS WITH HYPERGLYCEMIA: Your blood sugar levels have been high despite increasing your insulin  and Mounjaro  doses. We also discussed your issues with CGM adherence. -Increase Lantus  to 25 units. Monitor your blood glucose and increase 2 units every 3 days until fasting blood sugar less than 150. -Increase Mounjaro  to 10 mg weekly. Monitor for constipation. -We provided a CGM sample and instructed you on proper application with adhesive tape. -Continue to reduce carbohydrates in your diet and use your CGM to identify foods that cause high blood sugar. -Follow up in two weeks to assess your blood sugar control and make any necessary medication adjustments. "

## 2024-08-13 NOTE — Addendum Note (Signed)
 Addended by: VINCENTE SABER on: 08/13/2024 12:44 PM   Modules accepted: Level of Service

## 2024-08-14 ENCOUNTER — Encounter: Payer: Self-pay | Admitting: Nurse Practitioner

## 2024-08-18 ENCOUNTER — Telehealth: Payer: Self-pay

## 2024-08-18 DIAGNOSIS — E1165 Type 2 diabetes mellitus with hyperglycemia: Secondary | ICD-10-CM

## 2024-08-18 MED ORDER — LANTUS SOLOSTAR 100 UNIT/ML ~~LOC~~ SOPN: 32.0000 [IU] | PEN_INJECTOR | Freq: Every day | SUBCUTANEOUS | 99 refills | Status: DC

## 2024-08-18 NOTE — Addendum Note (Signed)
 Addended by: Gal Smolinski on: 08/18/2024 03:46 PM   Modules accepted: Orders

## 2024-08-18 NOTE — Telephone Encounter (Signed)
 Left message for patient to give our office a call back to discuss Dr Abbey recommendations of medication adjustments.   OK for E2C2 to give note if patient calls back if patient calls back. If relayed, please notify the office.

## 2024-08-18 NOTE — Telephone Encounter (Signed)
 Copied from CRM #[id number]. Topic: Clinical - Medical Advice >> Aug 18, 2024  3:30 PM Charolett L wrote: Reason for CRM: [name] Patient blood glucose was outside of parameter at 229. The parameter is 180 and he's taking 30 units daily

## 2024-08-18 NOTE — Telephone Encounter (Signed)
 Please reach out to home health nurse to recommend adjusting Lantus  from 30 units daily to 32 units daily.   Thank you,  Luke Shade, MD

## 2024-08-19 NOTE — Telephone Encounter (Signed)
 Rosina said she would take care of it.

## 2024-08-25 ENCOUNTER — Ambulatory Visit (INDEPENDENT_AMBULATORY_CARE_PROVIDER_SITE_OTHER): Admitting: Podiatry

## 2024-08-25 ENCOUNTER — Ambulatory Visit

## 2024-08-25 ENCOUNTER — Encounter: Payer: Self-pay | Admitting: Podiatry

## 2024-08-25 VITALS — BP 130/75 | HR 88 | Temp 97.6°F | Ht 71.0 in | Wt 318.2 lb

## 2024-08-25 DIAGNOSIS — Z89421 Acquired absence of other right toe(s): Secondary | ICD-10-CM

## 2024-08-25 DIAGNOSIS — Z9889 Other specified postprocedural states: Secondary | ICD-10-CM

## 2024-08-25 DIAGNOSIS — E871 Hypo-osmolality and hyponatremia: Secondary | ICD-10-CM

## 2024-08-25 DIAGNOSIS — E1129 Type 2 diabetes mellitus with other diabetic kidney complication: Secondary | ICD-10-CM | POA: Insufficient documentation

## 2024-08-25 DIAGNOSIS — E1159 Type 2 diabetes mellitus with other circulatory complications: Secondary | ICD-10-CM

## 2024-08-25 DIAGNOSIS — E1165 Type 2 diabetes mellitus with hyperglycemia: Secondary | ICD-10-CM | POA: Insufficient documentation

## 2024-08-25 DIAGNOSIS — Q666 Other congenital valgus deformities of feet: Secondary | ICD-10-CM

## 2024-08-25 DIAGNOSIS — E1169 Type 2 diabetes mellitus with other specified complication: Secondary | ICD-10-CM

## 2024-08-25 MED ORDER — TIRZEPATIDE 12.5 MG/0.5ML ~~LOC~~ SOAJ: 12.5000 mg | SUBCUTANEOUS | 1 refills | Status: AC

## 2024-08-25 MED ORDER — SPIRONOLACTONE 50 MG PO TABS: 50.0000 mg | ORAL_TABLET | Freq: Every day | ORAL | 3 refills | Status: AC

## 2024-08-25 MED ORDER — DAPAGLIFLOZIN PROPANEDIOL 10 MG PO TABS: 10.0000 mg | ORAL_TABLET | Freq: Every day | ORAL | 3 refills | Status: AC

## 2024-08-25 MED ORDER — TELMISARTAN 80 MG PO TABS: 80.0000 mg | ORAL_TABLET | Freq: Every day | ORAL | 3 refills | Status: AC

## 2024-08-25 MED ORDER — CARVEDILOL 12.5 MG PO TABS: 12.5000 mg | ORAL_TABLET | Freq: Two times a day (BID) | ORAL | 3 refills | Status: AC

## 2024-08-25 MED ORDER — LANTUS SOLOSTAR 100 UNIT/ML ~~LOC~~ SOPN: 36.0000 [IU] | PEN_INJECTOR | Freq: Every day | SUBCUTANEOUS | 1 refills | Status: AC

## 2024-08-25 MED ORDER — METFORMIN HCL ER 500 MG PO TB24: 500.0000 mg | ORAL_TABLET | Freq: Two times a day (BID) | ORAL | 3 refills | Status: AC

## 2024-08-25 NOTE — Patient Instructions (Addendum)
-   Start Metformin  500 mg twice a day with food. I have sent prescription for this.  - Increase Mounjaro  from 7.5 mg to 10 mg once weekly for 4 weeks then go up to 12.5 mg weekly.  - Start Farxiga  10 mg daily. You will notice increase in urination when you are on this medication. Please make sure to stay hydrated. If you develop rash after starting this medication, please stop and reach out to our clinic.  - Restart Spironolactone  50 mg daily.   - I am referring you to nephrologist or kidney doctor. Please reach out to us  if you do not hear from kidney doctor in couple of weeks.  - Increase Lantus  to 36 units at bedtime.  - General BG target: Fasting glucose 130 mg/dL and postprandial 819 mg/dL or less.  - Please schedule annual diabetic eye exam. I have put in referral to ophthalmology for eye exam.  - Finish lisinopril  40 mg daily that you have at home. Once you are done with this start Telmisartan  80 mg daily.   Follow up with Dr. Abbey in 1 month. Schedule a fasting lab at your convenience.    You can update shingles vaccine through your local pharmacy.

## 2024-08-25 NOTE — Assessment & Plan Note (Deleted)
" °  Orders:   Urine Microalbumin w/creat. ratio; Future  "

## 2024-08-25 NOTE — Assessment & Plan Note (Addendum)
 Blood pressure on arrival above goal, goal blood pressure less than 130 x 80 mmHg.  No chest pain, palpitations, lower leg edema.  - Finish lisinopril  40 mg daily (previous prescription), then transition to telmisartan  80 mg daily. - Continue spironolactone  50 mg daily.  Follow-up in 1 month to repeat renal function.  - Continue carvedilol  12.5 mg twice daily.  Continue amlodipine  10 mg daily. - Continue home blood pressure monitoring. Orders:   spironolactone  (ALDACTONE ) 50 MG tablet; Take 1 tablet (50 mg total) by mouth daily.   carvedilol  (COREG ) 12.5 MG tablet; Take 1 tablet (12.5 mg total) by mouth 2 (two) times daily with a meal.   telmisartan  (MICARDIS ) 80 MG tablet; Take 1 tablet (80 mg total) by mouth daily.   AMB Referral VBCI Care Management   Comp Met (CMET); Future

## 2024-08-25 NOTE — Assessment & Plan Note (Addendum)
 Uncontrolled type 2 diabetes mellitus with hyperglycemia, diabetic kidney disease evident by positive microalbuminuria, hypertension above goal, hyperlipidemia. Current regimen inadequate for glycemic control. Discussed risks of end organ damage and emphasized importance of blood glucose control. - Started metformin  500 mg twice daily with food.  (Previously treated with metformin  patient does not recall why this was discontinued).  - Increased Mounjaro  to 10 mg weekly for 4 weeks, then to 12.5 mg. New prescription sent to the pharmacy. Currently on 7.5 mg weekly as he still had 7.5 mg pen at home.  - Started Farxiga  10 mg daily, monitor for rash and dehydration.  Reach out to clinic if any side effect occurs. - Increased Lantus  to 36 units at bedtime from 34 units. General BG target: Fasting glucose 130 mg/dL or less and postprandial 180 mg/dL or less.   - Scheduled fasting labs including A1c and proteinuria. - Referred to nephrologist for kidney evaluation. - Referred to clinical pharmacist for diabetes management. - Scheduled annual diabetic eye exam. Referral to ophthalmology made today.  - Instructed to monitor blood glucose closely. -Home blood glucose/CGM readings:  14 days average 230 mg per DL 7 days average 229 mg per DL No hypoglycemia episodes noted. - Had appointment with podiatry earlier today. Recommend every 3 monthly follow up with podiatrist for general diabetic feet care. Continue carbohydrate consumption reduction.  - F/U in 4 weeks or sooner if needed.  - Plan to update shingles immunization through local pharmacy.  Orders:   HgB A1c; Future   insulin  glargine (LANTUS  SOLOSTAR) 100 UNIT/ML Solostar Pen; Inject 36 Units into the skin at bedtime.   tirzepatide  (MOUNJARO ) 12.5 MG/0.5ML Pen; Inject 12.5 mg into the skin once a week.   metFORMIN  (GLUCOPHAGE -XR) 500 MG 24 hr tablet; Take 1 tablet (500 mg total) by mouth 2 (two) times daily.   Ambulatory referral to  Ophthalmology   AMB Referral VBCI Care Management   Comp Met (CMET); Future

## 2024-08-25 NOTE — Assessment & Plan Note (Addendum)
 Discussed potential for weight loss with improved glycemic control and medication adjustments (specially insulin ). Continue dietary modifications focusing on low carbohydrate intake and exercise as tolerated.

## 2024-08-25 NOTE — Assessment & Plan Note (Addendum)
 Plan uncontrolled type 2 diabetes with long-term current use of insulin .  Recommend morning labs to check for urine microalbumin creatinine ratio.  Future lab order. Orders:   Urine Microalbumin w/creat. ratio; Future   dapagliflozin  propanediol (FARXIGA ) 10 MG TABS tablet; Take 1 tablet (10 mg total) by mouth daily.   Ambulatory referral to Nephrology

## 2024-08-25 NOTE — Assessment & Plan Note (Signed)
 Current foot health stable with no new infections or complications. Continue regular podiatric follow-up every 3-4 months. Monitor for any changes in foot health and report immediately. Is not taking Oxycodone  for pain controlled. Discontinued.

## 2024-08-26 ENCOUNTER — Telehealth: Payer: Self-pay

## 2024-08-26 ENCOUNTER — Other Ambulatory Visit

## 2024-08-26 DIAGNOSIS — E1169 Type 2 diabetes mellitus with other specified complication: Secondary | ICD-10-CM

## 2024-08-26 DIAGNOSIS — E1129 Type 2 diabetes mellitus with other diabetic kidney complication: Secondary | ICD-10-CM

## 2024-08-26 DIAGNOSIS — E1159 Type 2 diabetes mellitus with other circulatory complications: Secondary | ICD-10-CM

## 2024-08-26 DIAGNOSIS — E1165 Type 2 diabetes mellitus with hyperglycemia: Secondary | ICD-10-CM

## 2024-08-26 LAB — COMPREHENSIVE METABOLIC PANEL WITH GFR
ALT: 18 U/L (ref 3–53)
AST: 15 U/L (ref 5–37)
Albumin: 4.2 g/dL (ref 3.5–5.2)
Alkaline Phosphatase: 51 U/L (ref 39–117)
BUN: 15 mg/dL (ref 6–23)
CO2: 25 meq/L (ref 19–32)
Calcium: 9.3 mg/dL (ref 8.4–10.5)
Chloride: 101 meq/L (ref 96–112)
Creatinine, Ser: 0.63 mg/dL (ref 0.40–1.50)
GFR: 106.98 mL/min
Glucose, Bld: 227 mg/dL — ABNORMAL HIGH (ref 70–99)
Potassium: 4.4 meq/L (ref 3.5–5.1)
Sodium: 136 meq/L (ref 135–145)
Total Bilirubin: 0.5 mg/dL (ref 0.2–1.2)
Total Protein: 6.9 g/dL (ref 6.0–8.3)

## 2024-08-26 LAB — LIPID PANEL
Cholesterol: 159 mg/dL (ref 28–200)
HDL: 36.3 mg/dL — ABNORMAL LOW
LDL Cholesterol: 66 mg/dL (ref 10–99)
NonHDL: 122.69
Total CHOL/HDL Ratio: 4
Triglycerides: 283 mg/dL — ABNORMAL HIGH (ref 10.0–149.0)
VLDL: 56.6 mg/dL — ABNORMAL HIGH (ref 0.0–40.0)

## 2024-08-26 LAB — MICROALBUMIN / CREATININE URINE RATIO
Creatinine,U: 144.3 mg/dL
Microalb Creat Ratio: 378.7 mg/g — ABNORMAL HIGH (ref 0.0–30.0)
Microalb, Ur: 54.6 mg/dL — ABNORMAL HIGH (ref 0.7–1.9)

## 2024-08-26 LAB — HEMOGLOBIN A1C: Hgb A1c MFr Bld: 10.7 % — ABNORMAL HIGH (ref 4.6–6.5)

## 2024-08-26 NOTE — Progress Notes (Signed)
 Complex Care Management Note  Care Guide Note 08/26/2024 Name: Shiquan Mathieu MRN: [id number] DOB: June 14, 1971  Matix Henshaw is a 54 y.o. year old male who sees Bair, Kalpana, MD for primary care. I reached out to [name] by phone today to offer complex care management services.  Mr. Pennisi was given information about Complex Care Management services today including:   The Complex Care Management services include support from the care team which includes your Nurse Care Manager, Clinical Social Worker, or Pharmacist.  The Complex Care Management team is here to help remove barriers to the health concerns and goals most important to you. Complex Care Management services are voluntary, and the patient may decline or stop services at any time by request to their care team member.   Complex Care Management Consent Status: Patient agreed to services and verbal consent obtained.   Follow up plan:  Telephone appointment with complex care management team member scheduled for:  09/01/24 at 3:00 p.m.   Encounter Outcome:  Patient Scheduled [facility], [facility] VBCI Assistant Direct Dial: [phone]  Fax: [phone]

## 2024-08-27 ENCOUNTER — Ambulatory Visit: Payer: Self-pay

## 2024-09-01 ENCOUNTER — Other Ambulatory Visit

## 2024-09-22 ENCOUNTER — Ambulatory Visit

## 2024-12-16 ENCOUNTER — Ambulatory Visit: Admitting: Endocrinology
